# Patient Record
Sex: Female | Born: 1985 | Race: Black or African American | Hispanic: No | Marital: Single | Smoking: Current every day smoker
Health system: Southern US, Community
[De-identification: ages and names within clinical notes are randomized; demographics above are authoritative.]

## PROBLEM LIST (undated history)

## (undated) DIAGNOSIS — I10 Essential (primary) hypertension: Secondary | ICD-10-CM

## (undated) DIAGNOSIS — D571 Sickle-cell disease without crisis: Secondary | ICD-10-CM

## (undated) DIAGNOSIS — D573 Sickle-cell trait: Secondary | ICD-10-CM

---

## 2005-02-02 ENCOUNTER — Emergency Department (HOSPITAL_COMMUNITY): Admission: EM | Admit: 2005-02-02 | Discharge: 2005-02-02 | Payer: Self-pay | Admitting: Emergency Medicine

## 2005-07-06 ENCOUNTER — Emergency Department (HOSPITAL_COMMUNITY): Admission: EM | Admit: 2005-07-06 | Discharge: 2005-07-06 | Payer: Self-pay | Admitting: Emergency Medicine

## 2005-09-14 ENCOUNTER — Emergency Department (HOSPITAL_COMMUNITY): Admission: EM | Admit: 2005-09-14 | Discharge: 2005-09-14 | Payer: Self-pay | Admitting: Emergency Medicine

## 2010-03-26 ENCOUNTER — Emergency Department (HOSPITAL_COMMUNITY): Admission: EM | Admit: 2010-03-26 | Discharge: 2010-03-26 | Payer: Self-pay | Admitting: Emergency Medicine

## 2010-04-08 ENCOUNTER — Emergency Department (HOSPITAL_COMMUNITY): Admission: EM | Admit: 2010-04-08 | Discharge: 2010-04-08 | Payer: Self-pay | Admitting: Emergency Medicine

## 2010-06-10 ENCOUNTER — Emergency Department (HOSPITAL_COMMUNITY): Admission: EM | Admit: 2010-06-10 | Discharge: 2010-06-10 | Payer: Self-pay | Admitting: Emergency Medicine

## 2010-07-21 ENCOUNTER — Emergency Department (HOSPITAL_COMMUNITY): Admission: EM | Admit: 2010-07-21 | Discharge: 2010-07-21 | Payer: Self-pay | Admitting: Emergency Medicine

## 2010-08-02 ENCOUNTER — Emergency Department (HOSPITAL_COMMUNITY): Admission: EM | Admit: 2010-08-02 | Discharge: 2010-08-02 | Payer: Self-pay | Admitting: Emergency Medicine

## 2010-08-10 ENCOUNTER — Emergency Department (HOSPITAL_COMMUNITY): Admission: EM | Admit: 2010-08-10 | Discharge: 2010-08-10 | Payer: Self-pay | Admitting: Emergency Medicine

## 2010-10-05 ENCOUNTER — Emergency Department (HOSPITAL_COMMUNITY)
Admission: EM | Admit: 2010-10-05 | Discharge: 2010-10-06 | Payer: Self-pay | Source: Home / Self Care | Admitting: Emergency Medicine

## 2010-11-02 ENCOUNTER — Inpatient Hospital Stay (HOSPITAL_COMMUNITY)
Admission: EM | Admit: 2010-11-02 | Discharge: 2010-11-05 | Payer: Self-pay | Source: Home / Self Care | Attending: Internal Medicine | Admitting: Internal Medicine

## 2010-11-03 LAB — CARDIAC PANEL(CRET KIN+CKTOT+MB+TROPI)
CK, MB: 1 ng/mL (ref 0.3–4.0)
CK, MB: 1.4 ng/mL (ref 0.3–4.0)
CK, MB: 1.7 ng/mL (ref 0.3–4.0)
Relative Index: INVALID (ref 0.0–2.5)
Relative Index: 1.4 (ref 0.0–2.5)
Relative Index: 1.1 (ref 0.0–2.5)
Total CK: 84 U/L (ref 7–177)
Total CK: 101 U/L (ref 7–177)
Total CK: 149 U/L (ref 7–177)
Troponin I: 0.01 ng/mL (ref 0.00–0.06)
Troponin I: 0.02 ng/mL (ref 0.00–0.06)
Troponin I: 0.05 ng/mL (ref 0.00–0.06)

## 2010-11-03 LAB — POCT I-STAT, CHEM 8
BUN: 5 mg/dL — ABNORMAL LOW (ref 6–23)
Calcium, Ion: 1.23 mmol/L (ref 1.12–1.32)
Chloride: 104 mEq/L (ref 96–112)
Creatinine, Ser: 0.8 mg/dL (ref 0.4–1.2)
Glucose, Bld: 62 mg/dL — ABNORMAL LOW (ref 70–99)
HCT: 44 % (ref 36.0–46.0)
Hemoglobin: 15 g/dL (ref 12.0–15.0)
Potassium: 3.9 mEq/L (ref 3.5–5.1)
Sodium: 140 mEq/L (ref 135–145)
TCO2: 28 mmol/L (ref 0–100)

## 2010-11-03 LAB — DIFFERENTIAL
Basophils Absolute: 0.1 10*3/uL (ref 0.0–0.1)
Basophils Relative: 1 % (ref 0–1)
Eosinophils Absolute: 0.2 10*3/uL (ref 0.0–0.7)
Eosinophils Relative: 3 % (ref 0–5)
Lymphocytes Relative: 32 % (ref 12–46)
Lymphs Abs: 1.9 10*3/uL (ref 0.7–4.0)
Monocytes Absolute: 0.6 10*3/uL (ref 0.1–1.0)
Monocytes Relative: 9 % (ref 3–12)
Neutro Abs: 3.3 10*3/uL (ref 1.7–7.7)
Neutrophils Relative %: 55 % (ref 43–77)

## 2010-11-03 LAB — MRSA PCR SCREENING: MRSA by PCR: NEGATIVE

## 2010-11-03 LAB — TSH: TSH: 0.147 u[IU]/mL — ABNORMAL LOW (ref 0.350–4.500)

## 2010-11-03 LAB — RAPID URINE DRUG SCREEN, HOSP PERFORMED
Amphetamines: NOT DETECTED
Barbiturates: NOT DETECTED
Benzodiazepines: NOT DETECTED
Cocaine: NOT DETECTED
Opiates: NOT DETECTED
Tetrahydrocannabinol: NOT DETECTED

## 2010-11-03 LAB — CBC
HCT: 35.1 % — ABNORMAL LOW (ref 36.0–46.0)
Hemoglobin: 12.6 g/dL (ref 12.0–15.0)
MCH: 32.3 pg (ref 26.0–34.0)
MCHC: 35.9 g/dL (ref 30.0–36.0)
MCV: 90 fL (ref 78.0–100.0)
Platelets: 188 10*3/uL (ref 150–400)
RBC: 3.9 MIL/uL (ref 3.87–5.11)
RDW: 14.8 % (ref 11.5–15.5)
WBC: 6.1 10*3/uL (ref 4.0–10.5)

## 2010-11-03 LAB — D-DIMER, QUANTITATIVE: D-Dimer, Quant: 0.43 ug/mL-FEU (ref 0.00–0.48)

## 2010-11-03 LAB — PREGNANCY, URINE: Preg Test, Ur: NEGATIVE

## 2010-11-05 LAB — URINALYSIS, ROUTINE W REFLEX MICROSCOPIC
Bilirubin Urine: NEGATIVE
Hgb urine dipstick: NEGATIVE
Ketones, ur: NEGATIVE mg/dL
Nitrite: NEGATIVE
Protein, ur: NEGATIVE mg/dL
Specific Gravity, Urine: 1.015 (ref 1.005–1.030)
Urine Glucose, Fasting: NEGATIVE mg/dL
Urobilinogen, UA: 0.2 mg/dL (ref 0.0–1.0)
pH: 7 (ref 5.0–8.0)

## 2010-11-05 LAB — CBC
HCT: 31 % — ABNORMAL LOW (ref 36.0–46.0)
Hemoglobin: 10.9 g/dL — ABNORMAL LOW (ref 12.0–15.0)
MCH: 31.5 pg (ref 26.0–34.0)
MCHC: 35.2 g/dL (ref 30.0–36.0)
MCV: 89.6 fL (ref 78.0–100.0)
Platelets: 170 10*3/uL (ref 150–400)
RBC: 3.46 MIL/uL — ABNORMAL LOW (ref 3.87–5.11)
RDW: 14.8 % (ref 11.5–15.5)
WBC: 4 10*3/uL (ref 4.0–10.5)

## 2010-11-05 LAB — BASIC METABOLIC PANEL
BUN: 6 mg/dL (ref 6–23)
CO2: 28 mEq/L (ref 19–32)
Calcium: 9.2 mg/dL (ref 8.4–10.5)
Chloride: 104 mEq/L (ref 96–112)
Creatinine, Ser: 0.64 mg/dL (ref 0.4–1.2)
GFR calc Af Amer: >60 mL/min (ref >60)
GFR calc non Af Amer: >60 mL/min (ref >60)
Glucose, Bld: 95 mg/dL (ref 70–99)
Potassium: 3.9 mEq/L (ref 3.5–5.1)
Sodium: 139 mEq/L (ref 135–145)

## 2010-11-05 LAB — T4, FREE: Free T4: 0.94 ng/dL (ref 0.80–1.80)

## 2010-11-05 NOTE — H&P (Signed)
NAMEXAVIER, Sylvia Vega NO.:  [id number]  MEDICAL RECORD NO.:  [id number]          PATIENT TYPE:  INP  LOCATION:  0103                         FACILITY:  [facility]  PHYSICIAN:  Houston Siren, MD           DATE OF BIRTH:  26-Apr-1986  DATE OF ADMISSION:  11/02/2010 DATE OF DISCHARGE:                             HISTORY & PHYSICAL   PRIMARY CARE PHYSICIAN:  No primary care physician.  REASON FOR ADMISSION:  Tachycardia.   ADVANCED DIRECTIVE: FULL CODE  HISTORY OF PRESENT ILLNESS:  This is a 25 year old female with history of left carotid tumor, status post surgical excision along with tobacco use; prior history of tachycardia; migraine headache; sickle cell trait, presented to the emergency room with upper respiratory infection symptoms.  She stated she has been coughing along with having chest pain and palpitations.  She has also been mildly short of breath as well for the past week.  She has no ill contact or any distant travel.  She was given 2 nebulizer treatments in the emergency room which resulted increased in heart rate from 110 which is her baseline up to 140 and sustained.  An EKG was performed showing sinus tachycardia without any acute ST-T changes.  She also has a normal white count of 6,100, hemoglobin of 12.6.  Her chest x-ray is clear.  She was given Dilaudid and Ativan and her heart rate still sustained at around 130-140.  Her creatinine is 0.8.  Hospitalist was asked to admit the patient because of the tachycardia.  She denies any drug use, specifically no cocaine. She did admit to taking over-the-counter decongestant for past few days.  PAST MEDICAL HISTORY:  As above.  SOCIAL HISTORY:  She is a tobacco user, but denied any alcohol or drug use.  She has a partner who is at her bedside.  MEDICATIONS:  She is supposed to be on folic acid, iron, amitriptyline, Depakote, ibuprofen, but she has not taken this medication for many months.  ALLERGIES:   No known drug allergies.  PHYSICAL EXAMINATION:  VITAL SIGNS:  Blood pressure 107/54, heart rate 130-140, respiratory rate of 18, temperature 98.5. GENERAL:  She is hoarse, but in no apparent distress.  She has facial symmetry and speech is fluent. NECK:  Supple, status post left surgical excision scar well healed of the carotid tumor. CARDIAC:  S1 and S2, regular and tachy with no murmur, rub, or gallop. LUNGS:  Scattered rhonchi, but no wheezes, no rales. ABDOMINAL:  Soft, nondistended, nontender. EXTREMITIES:  No tremor.  No edema.  No calf tenderness.  Good distal pulses bilaterally. SKIN:  Warm and dry.  OBJECTIVE FINDINGS:  EKG shows sinus tachycardia at 138 without any acute ST-T changes.  Creatinine of 0.8, glucose of 62.  White count of 6100, hemoglobin of 12.6.  Chest x-ray showed no active disease.  IMPRESSION:  This is a 25 year old female with upper respiratory symptoms, presents with persistent tachycardia of unclear exact etiology.  She could have tachycardia from chest pain, anxiety, or over- the-counter decongestants.  She did have carotid tumor removed and I wonder whether or not  this has upset her feedback regulation as well.  Since she is tachycardic and symptomatic from it, we will start her on IV Cardizem and put her in the step-down unit.  I will treat her upper respiratory infection with Zithromax and Rocephin.  She does not need any more nebulizer treatment nor prednisone.  As part of workup, we will get urinary drug screen, pregnancy test, 24-hour urine for VMA and metanephrines, and TSH.  She is currently stable.  We will give her some pain medication and rule out with serial CPKs and troponins as well.  She will need an echo to make sure she has a normal structural heart.     Houston Siren, MD     PL/MEDQ  D:  11/02/2010  T:  11/02/2010  Job:  [id number]  Electronically Signed by Houston Siren  on 11/03/2010 05:10:21 AM

## 2010-11-05 NOTE — Discharge Summary (Signed)
Sylvia Vega, Sylvia Vega             ACCOUNT NO.:  [id number]  MEDICAL RECORD NO.:  [id number]          PATIENT TYPE:  INP  LOCATION:  1427                         FACILITY:  [facility]  PHYSICIAN:  Andreas Blower, MD       DATE OF BIRTH:  1986-07-10  DATE OF ADMISSION:  11/02/2010 DATE OF DISCHARGE:                              DISCHARGE SUMMARY   PRIMARY CARE PHYSICIAN:  None.  DISCHARGE DIAGNOSES: 1. Tachycardia, resolved. 2. Chest pain, resolved. 3. Cough due to bronchitis. 4. Bronchitis. 5. Abdominal pain secondary to constipation. 6. Constipation. 7. Questionable right uterovesical junction stone. 8. History of migraines. 9. History of sickle cell trait 10.History of left carotid tumor. 11.Tobacco use. 12.Hypothyroidism.  DISCHARGE MEDICATIONS: 1. Azithromycin 250 mg p.o. daily for 2 more days. 2. Bisacodyl suppository 10 mg daily as needed for constipation. 3. Docusate 100 mg p.o. twice daily for 30 days, hold if any diarrhea. 4. Levothyroxine 25 mcg p.o. daily. 5. Zofran 4 mg q.6h. as needed. 6. Polyethylene glycol 17 g p.o. daily. 7. Senna 1 tablet p.o. b.i.d. 8. Oxycodone/acetaminophen 5/325 mg every 6 hours as needed. 9. Ferrous sulfate 325 mg p.o. daily. 10.Folic acid 1 mg p.o. daily. 11.Ibuprofen 800 mg p.o. every 8 hours as needed for pain. 12.Imitrex p.o. daily as needed for headaches. 13.Omeprazole 20 mg p.o. daily. 14.Zyrtec 10 mg p.o. daily.  BRIEF ADMITTING HISTORY AND PHYSICAL:  Sylvia Vega is a 25 year old African American female with history of left carotid tumor, status post surgically excision, no tobacco use and migraines sickle cell trait who presented with upper respiratory infection symptoms and tachycardia.  RADIOLOGY/IMAGING: 1. The patient had a chest x-ray, 2-view which shows no active lung     disease. 2. The patient had x-ray of the abdomen which shows marked stool     burden throughout the colon.  No evidence of obstruction.  Calcification of left side of the pelvis new since 2006 may reflect     new phlebolith, could not exclude distal ureteral stone. 3. The patient had a CT of the abdomen, pelvis without contrast which     showed poor visualization of uterus secondary to little intra-     abdominal peritoneal bath.  Possible right UVJ stone.  No definite     left ureteral calculi, no hydronephrosis.  LABORATORY DATA:  CBC shows a white count of 4.0, hemoglobin 10.9, hematocrit 31.0, platelet count 170.  Electrolytes normal with a creatinine of 0.64.  Troponins negative x3.  Initial TSH was 0.147, free T4 was 0.94.  Urine pregnancy was negative.  Urine drug screen was negative.  UA was negative for nitrites, leukocytes, and was negative for blood.  MRSA by PCR was negative.  Urine and serum catecholamines are pending.  DISPOSITION AND FOLLOWUP:  The patient was instructed to contact her insurance and find a PCP within her network provider in [place] or back home.  The patient was also given instructions to minimize pain medications as much as possible and if her abdominal pain symptoms are not improving, she needs to follow up with her primary care physician and consider a referal to GI.  The patient was also given instructions to have her primary care physician follow up on her urine and serum and catecholamine results.  ASSESSMENT/PLAN: 1. Tachycardia, etiology unclear, most likely secondary to     dehydration.  The patient was initially started on diltiazem drip     and was eventually weaned off of diltiazem after she was     successfully hydrated.  Heart rate at the time of discharge was     normal at 80.  The patient had urine and serum catecholamines sent     and the patient will need her primary care physician to follow up     on these results.  This was first discussed with the patient. 2. Abdominal pain, etiology unclear.  The patient had x-ray which     showed a stool burden.  As a result,  was started on stool     softeners, GoLYTELY, MiraLax, and magnesium citrate.  During the     course of hospital stay after she has had a few bowel movements,     her abdominal pain improved significantly.  The patient was given     strict instructions to minimize pain medications as much as     possible at home.  I also instructed the patient that if her     abdominal pain does not improve, then she needs to contact her     primary care physician to see if she would warrant a GI referral.     The patient received a CT of the abdomen and pelvis without     contrast which showed questionable right UVJ stone.  UA was     obtained which did not show any hematuria.  The patient on exam did     not have any significant flank pain.  Also on CT, did not show any     hydronephrosis.  Would recommend conservative management for now.     If she does not improve, could consider further workup by her     primary care physician. 3. Chest pain.  A D-dimer was normal.  Troponins were negative.  The     patient was ruled out.  The patient had a cough which may be the     reason why she is having chest pain.  The patient was started     initially on ceftriaxone and azithromycin.  At the time of     discharge, she will continue with azithromycin for two more days     for bronchitis. 4. History of migraines, was not an active issue during the course of     hospital stay. 5. History is sickle cell trait, not an active issue during the course     of hospital stay. 6. History of left carotid tumor, was not an active issue during the     hospital stay.  Will have her primary care physician follow up with     this issue. 7. Hypothyroidism.  The patient was started on Synthroid.  Free T4 was     normal.  The patient to follow up with her primary care physician     for further titration of her Synthroid as an outpatient.  Time spent on discharge talking with the patient and coordinating care was 35  minutes.   Andreas Blower, MD   SR/MEDQ  D:  11/05/2010  T:  11/05/2010  Job:  [id number]  Electronically Signed by Wardell Heath Loneta Tamplin  on 11/05/2010 08:39:17 PM

## 2010-11-06 LAB — URINE CULTURE
Colony Count: NO GROWTH
Culture  Setup Time: 201201271407
Culture: NO GROWTH
Special Requests: NEGATIVE

## 2010-11-11 LAB — CATECHOLAMINES, FRACTIONATED, URINE, 24 HOUR
Catecholamines T: 18 mcg/24 h — ABNORMAL LOW (ref 26–121)
Creatinine, Urine mg/day-CATEUR: 0.96 g/(24.h) (ref 0.63–2.50)
Dopamine 24 Hr Urine: 112 mcg/24 h (ref 52–480)
Norepinephrine 24 Hr Urine: 18 mcg/24 h (ref 15–100)
Total urine volume: 2050 mL

## 2010-11-15 LAB — VMA AND HVA, 24 HR UR
24 Hour urine volume (VMAHVA): 2050 mL/24 h
Creatinine, Urine mg/day-VMAUR: 0.98 g/(24.h) (ref 0.63–2.50)
Homovanillic Acid, 24H Ur: 1.8 mg/24 h (ref 1.6–7.5)
VMA, 24H Ur Adult: 2.3 mg/24 h (ref <=6.0)

## 2010-11-23 ENCOUNTER — Emergency Department (HOSPITAL_COMMUNITY)
Admission: EM | Admit: 2010-11-23 | Discharge: 2010-11-24 | Disposition: A | Discharge status: Home / Self Care | Payer: BC Managed Care – PPO | Attending: Emergency Medicine | Admitting: Emergency Medicine

## 2010-11-23 DIAGNOSIS — M545 Low back pain, unspecified: Secondary | ICD-10-CM | POA: Insufficient documentation

## 2010-11-23 DIAGNOSIS — R10812 Left upper quadrant abdominal tenderness: Secondary | ICD-10-CM | POA: Insufficient documentation

## 2010-11-23 DIAGNOSIS — D573 Sickle-cell trait: Secondary | ICD-10-CM | POA: Insufficient documentation

## 2010-11-23 LAB — URINALYSIS, ROUTINE W REFLEX MICROSCOPIC
Bilirubin Urine: NEGATIVE
Hgb urine dipstick: NEGATIVE
Ketones, ur: NEGATIVE mg/dL
Nitrite: NEGATIVE
Protein, ur: NEGATIVE mg/dL
Specific Gravity, Urine: 1.018 (ref 1.005–1.030)
Urine Glucose, Fasting: NEGATIVE mg/dL
Urobilinogen, UA: 0.2 mg/dL (ref 0.0–1.0)
pH: 6 (ref 5.0–8.0)

## 2010-11-23 LAB — POCT PREGNANCY, URINE: Preg Test, Ur: NEGATIVE

## 2010-12-22 LAB — URINALYSIS, ROUTINE W REFLEX MICROSCOPIC
Bilirubin Urine: NEGATIVE
Glucose, UA: NEGATIVE mg/dL
Hgb urine dipstick: NEGATIVE
Ketones, ur: NEGATIVE mg/dL
Nitrite: NEGATIVE
Protein, ur: NEGATIVE mg/dL
Specific Gravity, Urine: 1.019 (ref 1.005–1.030)
Urobilinogen, UA: 1 mg/dL (ref 0.0–1.0)
pH: 6 (ref 5.0–8.0)

## 2010-12-22 LAB — URINE MICROSCOPIC-ADD ON

## 2010-12-22 LAB — POCT PREGNANCY, URINE: Preg Test, Ur: NEGATIVE

## 2010-12-23 LAB — COMPREHENSIVE METABOLIC PANEL
ALT: 12 U/L (ref 0–35)
AST: 18 U/L (ref 0–37)
Albumin: 4.4 g/dL (ref 3.5–5.2)
Alkaline Phosphatase: 57 U/L (ref 39–117)
BUN: 9 mg/dL (ref 6–23)
CO2: 25 mEq/L (ref 19–32)
Calcium: 9.7 mg/dL (ref 8.4–10.5)
Chloride: 106 mEq/L (ref 96–112)
Creatinine, Ser: 0.75 mg/dL (ref 0.4–1.2)
GFR calc Af Amer: >60 mL/min (ref >60)
GFR calc non Af Amer: >60 mL/min (ref >60)
Glucose, Bld: 96 mg/dL (ref 70–99)
Potassium: 4.3 mEq/L (ref 3.5–5.1)
Sodium: 139 mEq/L (ref 135–145)
Total Bilirubin: 0.6 mg/dL (ref 0.3–1.2)
Total Protein: 7.7 g/dL (ref 6.0–8.3)

## 2010-12-23 LAB — DIFFERENTIAL
Basophils Absolute: 0 10*3/uL (ref 0.0–0.1)
Basophils Relative: 1 % (ref 0–1)
Eosinophils Absolute: 0.2 10*3/uL (ref 0.0–0.7)
Eosinophils Relative: 4 % (ref 0–5)
Lymphocytes Relative: 34 % (ref 12–46)
Lymphs Abs: 1.8 10*3/uL (ref 0.7–4.0)
Monocytes Absolute: 0.6 10*3/uL (ref 0.1–1.0)
Monocytes Relative: 11 % (ref 3–12)
Neutro Abs: 2.7 10*3/uL (ref 1.7–7.7)
Neutrophils Relative %: 51 % (ref 43–77)

## 2010-12-23 LAB — URINALYSIS, ROUTINE W REFLEX MICROSCOPIC
Bilirubin Urine: NEGATIVE
Glucose, UA: NEGATIVE mg/dL
Hgb urine dipstick: NEGATIVE
Ketones, ur: NEGATIVE mg/dL
Nitrite: NEGATIVE
Protein, ur: NEGATIVE mg/dL
Specific Gravity, Urine: 1.006 (ref 1.005–1.030)
Urobilinogen, UA: 0.2 mg/dL (ref 0.0–1.0)
pH: 6 (ref 5.0–8.0)

## 2010-12-23 LAB — CBC
HCT: 39.9 % (ref 36.0–46.0)
Hemoglobin: 13.8 g/dL (ref 12.0–15.0)
MCH: 33.3 pg (ref 26.0–34.0)
MCHC: 34.5 g/dL (ref 30.0–36.0)
MCV: 96.6 fL (ref 78.0–100.0)
Platelets: 191 10*3/uL (ref 150–400)
RBC: 4.13 MIL/uL (ref 3.87–5.11)
RDW: 15.4 % (ref 11.5–15.5)
WBC: 5.3 10*3/uL (ref 4.0–10.5)

## 2010-12-23 LAB — PREGNANCY, URINE
Preg Test, Ur: NEGATIVE
Preg Test, Ur: NEGATIVE

## 2010-12-26 ENCOUNTER — Emergency Department (HOSPITAL_COMMUNITY)
Admission: EM | Admit: 2010-12-26 | Discharge: 2010-12-26 | Disposition: A | Discharge status: Home / Self Care | Payer: BC Managed Care – PPO | Attending: Emergency Medicine | Admitting: Emergency Medicine

## 2010-12-26 DIAGNOSIS — J45909 Unspecified asthma, uncomplicated: Secondary | ICD-10-CM | POA: Insufficient documentation

## 2010-12-26 DIAGNOSIS — K089 Disorder of teeth and supporting structures, unspecified: Secondary | ICD-10-CM | POA: Insufficient documentation

## 2010-12-26 DIAGNOSIS — D573 Sickle-cell trait: Secondary | ICD-10-CM | POA: Insufficient documentation

## 2010-12-26 LAB — URINALYSIS, ROUTINE W REFLEX MICROSCOPIC
Bilirubin Urine: NEGATIVE
Glucose, UA: NEGATIVE mg/dL
Ketones, ur: NEGATIVE mg/dL
Nitrite: NEGATIVE
Protein, ur: 30 mg/dL — AB
Specific Gravity, Urine: 1.017 (ref 1.005–1.030)
Urobilinogen, UA: 1 mg/dL (ref 0.0–1.0)
pH: 7 (ref 5.0–8.0)

## 2010-12-26 LAB — POCT I-STAT, CHEM 8
BUN: 6 mg/dL (ref 6–23)
Calcium, Ion: 1.15 mmol/L (ref 1.12–1.32)
Chloride: 107 mEq/L (ref 96–112)
Creatinine, Ser: 0.7 mg/dL (ref 0.4–1.2)
Glucose, Bld: 93 mg/dL (ref 70–99)
HCT: 37 % (ref 36.0–46.0)
Hemoglobin: 12.6 g/dL (ref 12.0–15.0)
Potassium: 3.7 mEq/L (ref 3.5–5.1)
Sodium: 141 mEq/L (ref 135–145)
TCO2: 24 mmol/L (ref 0–100)

## 2010-12-26 LAB — URINE CULTURE: Colony Count: >=100000

## 2010-12-26 LAB — URINE MICROSCOPIC-ADD ON

## 2010-12-26 LAB — POCT PREGNANCY, URINE: Preg Test, Ur: NEGATIVE

## 2011-01-05 ENCOUNTER — Emergency Department (HOSPITAL_COMMUNITY): Payer: BC Managed Care – PPO

## 2011-01-05 ENCOUNTER — Emergency Department (HOSPITAL_COMMUNITY)
Admission: EM | Admit: 2011-01-05 | Discharge: 2011-01-06 | Disposition: A | Discharge status: Home / Self Care | Payer: BC Managed Care – PPO | Attending: Emergency Medicine | Admitting: Emergency Medicine

## 2011-01-05 DIAGNOSIS — D5700 Hb-SS disease with crisis, unspecified: Secondary | ICD-10-CM | POA: Insufficient documentation

## 2011-01-05 DIAGNOSIS — R109 Unspecified abdominal pain: Secondary | ICD-10-CM | POA: Insufficient documentation

## 2011-01-05 DIAGNOSIS — M545 Low back pain, unspecified: Secondary | ICD-10-CM | POA: Insufficient documentation

## 2011-01-05 DIAGNOSIS — A499 Bacterial infection, unspecified: Secondary | ICD-10-CM | POA: Insufficient documentation

## 2011-01-05 DIAGNOSIS — N760 Acute vaginitis: Secondary | ICD-10-CM | POA: Insufficient documentation

## 2011-01-05 DIAGNOSIS — B9689 Other specified bacterial agents as the cause of diseases classified elsewhere: Secondary | ICD-10-CM | POA: Insufficient documentation

## 2011-01-05 DIAGNOSIS — M79609 Pain in unspecified limb: Secondary | ICD-10-CM | POA: Insufficient documentation

## 2011-01-05 DIAGNOSIS — R110 Nausea: Secondary | ICD-10-CM | POA: Insufficient documentation

## 2011-01-05 LAB — COMPREHENSIVE METABOLIC PANEL
ALT: 9 U/L (ref 0–35)
AST: 13 U/L (ref 0–37)
Albumin: 4.1 g/dL (ref 3.5–5.2)
Alkaline Phosphatase: 67 U/L (ref 39–117)
BUN: 9 mg/dL (ref 6–23)
CO2: 22 mEq/L (ref 19–32)
Calcium: 9.3 mg/dL (ref 8.4–10.5)
Chloride: 109 mEq/L (ref 96–112)
Creatinine, Ser: 0.69 mg/dL (ref 0.4–1.2)
GFR calc Af Amer: >60 mL/min (ref >60)
GFR calc non Af Amer: >60 mL/min (ref >60)
Glucose, Bld: 73 mg/dL (ref 70–99)
Potassium: 3.5 mEq/L (ref 3.5–5.1)
Sodium: 139 mEq/L (ref 135–145)
Total Bilirubin: 0.5 mg/dL (ref 0.3–1.2)
Total Protein: 7.1 g/dL (ref 6.0–8.3)

## 2011-01-05 LAB — RETICULOCYTES
RBC.: 3.69 MIL/uL — ABNORMAL LOW (ref 3.87–5.11)
Retic Count, Absolute: 51.7 10*3/uL (ref 19.0–186.0)
Retic Ct Pct: 1.4 % (ref 0.4–3.1)

## 2011-01-05 LAB — DIFFERENTIAL
Basophils Absolute: 0 10*3/uL (ref 0.0–0.1)
Basophils Relative: 1 % (ref 0–1)
Eosinophils Absolute: 0.3 10*3/uL (ref 0.0–0.7)
Eosinophils Relative: 4 % (ref 0–5)
Lymphocytes Relative: 47 % — ABNORMAL HIGH (ref 12–46)
Lymphs Abs: 3 10*3/uL (ref 0.7–4.0)
Monocytes Absolute: 0.6 10*3/uL (ref 0.1–1.0)
Monocytes Relative: 9 % (ref 3–12)
Neutro Abs: 2.5 10*3/uL (ref 1.7–7.7)
Neutrophils Relative %: 39 % — ABNORMAL LOW (ref 43–77)

## 2011-01-05 LAB — CBC
HCT: 33.6 % — ABNORMAL LOW (ref 36.0–46.0)
Hemoglobin: 12 g/dL (ref 12.0–15.0)
MCH: 32.5 pg (ref 26.0–34.0)
MCHC: 35.7 g/dL (ref 30.0–36.0)
MCV: 91.1 fL (ref 78.0–100.0)
Platelets: 174 10*3/uL (ref 150–400)
RBC: 3.69 MIL/uL — ABNORMAL LOW (ref 3.87–5.11)
RDW: 14.3 % (ref 11.5–15.5)
WBC: 6.5 10*3/uL (ref 4.0–10.5)

## 2011-01-06 ENCOUNTER — Emergency Department (HOSPITAL_COMMUNITY)
Admission: EM | Admit: 2011-01-06 | Discharge: 2011-01-07 | Disposition: A | Discharge status: Home / Self Care | Payer: BC Managed Care – PPO | Attending: Emergency Medicine | Admitting: Emergency Medicine

## 2011-01-06 DIAGNOSIS — D5700 Hb-SS disease with crisis, unspecified: Secondary | ICD-10-CM | POA: Insufficient documentation

## 2011-01-06 DIAGNOSIS — J45909 Unspecified asthma, uncomplicated: Secondary | ICD-10-CM | POA: Insufficient documentation

## 2011-01-06 LAB — URINALYSIS, ROUTINE W REFLEX MICROSCOPIC
Bilirubin Urine: NEGATIVE
Glucose, UA: NEGATIVE mg/dL
Hgb urine dipstick: NEGATIVE
Ketones, ur: NEGATIVE mg/dL
Nitrite: NEGATIVE
Protein, ur: NEGATIVE mg/dL
Specific Gravity, Urine: 1.02 (ref 1.005–1.030)
Urobilinogen, UA: 0.2 mg/dL (ref 0.0–1.0)
pH: 6 (ref 5.0–8.0)

## 2011-01-06 LAB — WET PREP, GENITAL
Trich, Wet Prep: NONE SEEN
Yeast Wet Prep HPF POC: NONE SEEN

## 2011-01-06 LAB — GC/CHLAMYDIA PROBE AMP, GENITAL
Chlamydia, DNA Probe: NEGATIVE
GC Probe Amp, Genital: NEGATIVE

## 2011-01-06 LAB — POCT PREGNANCY, URINE: Preg Test, Ur: NEGATIVE

## 2011-01-10 IMAGING — CT CT NECK W/ CM
3 series · 13 of 20 positions shown, 15 images · IV contrast (agent unspecified)
Comparison: None.

CLINICAL DATA: History of carotid body tumor removed.  Now with
left-sided neck pain

CT NECK WITH CONTRAST
TECHNIQUE: Multidetector CT imaging of the neck was performed with
intravenous contrast.
Contrast: 100 ml [id number]

[Series 2: neck rtn st · axial · 0.39mm/px · z∈[+825,+957]mm · 5 of 67 slices shown]
[im 12/67  bone]
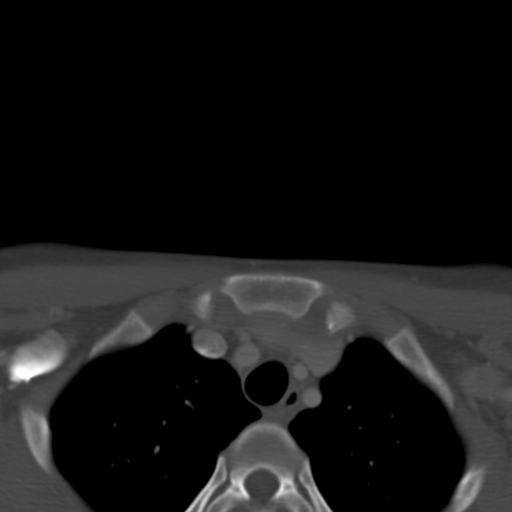
[im 23/67  bone]
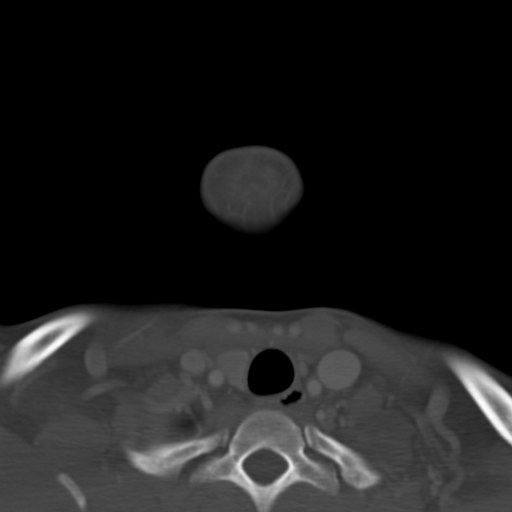
[im 34/67  bone]
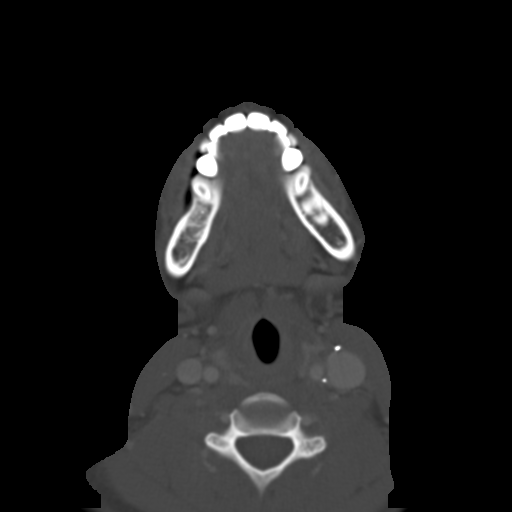
[im 45/67  bone]
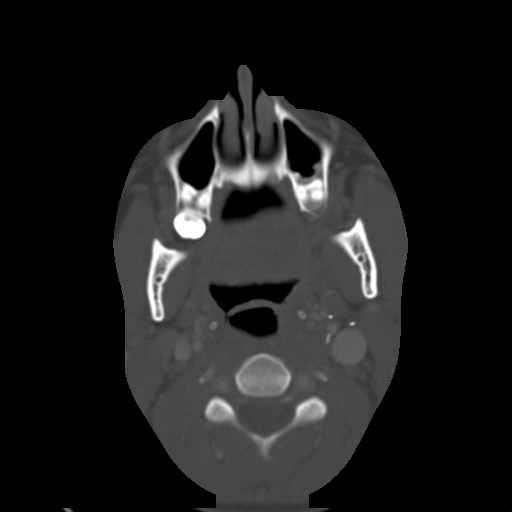
[im 56/67  bone]
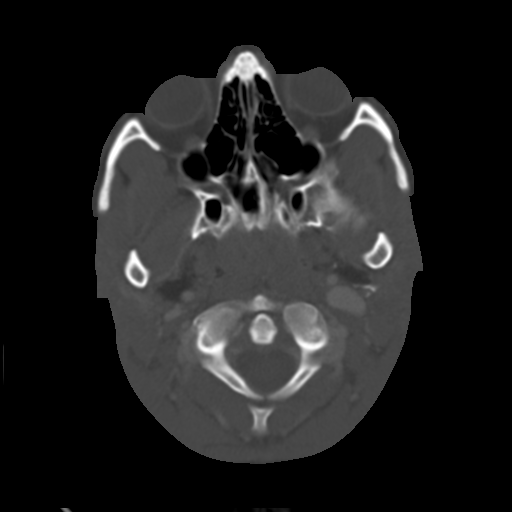

[Series 602: <mpr thick range> · axial · 0.39mm/px · z∈[+798,+929]mm · 5 of 66 slices shown, 7 images]
[im 11/66  soft-tissue]
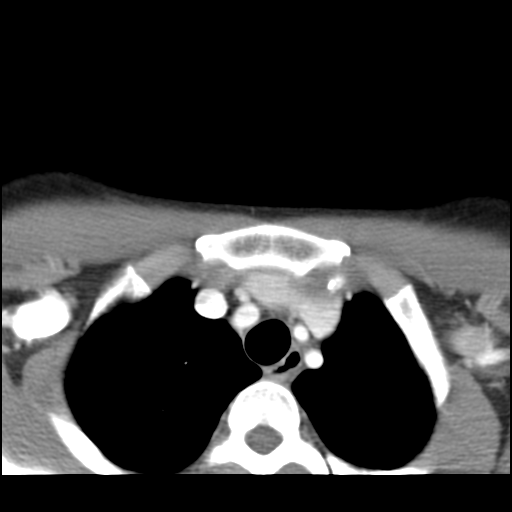
[im 11/66  bone]
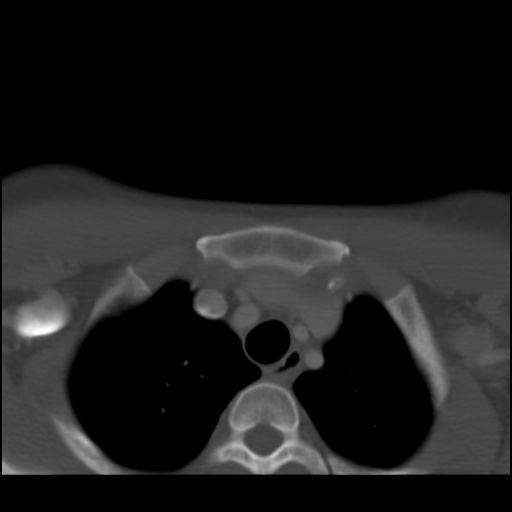
[im 22/66  bone]
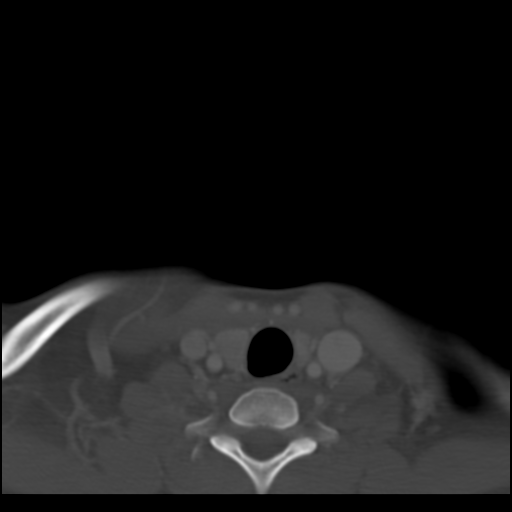
[im 33/66  bone]
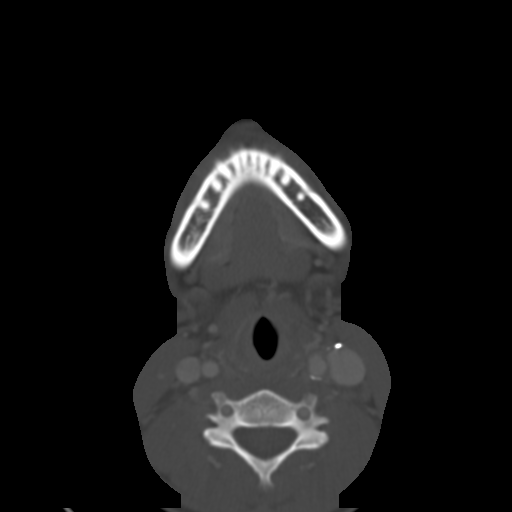
[im 44/66  bone]
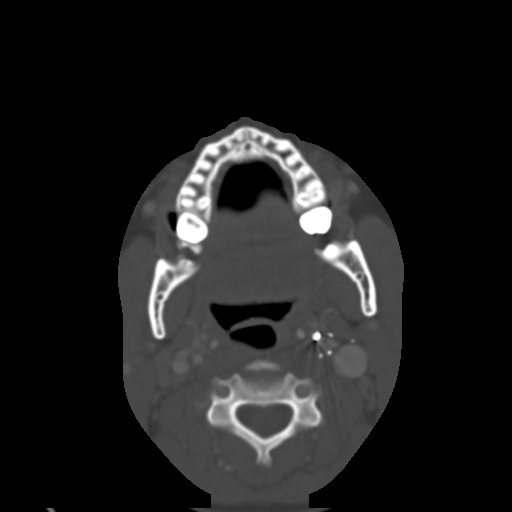
[im 55/66  soft-tissue]
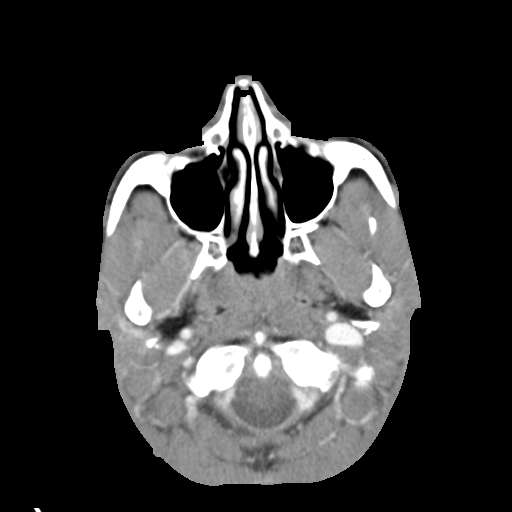
[im 55/66  bone]
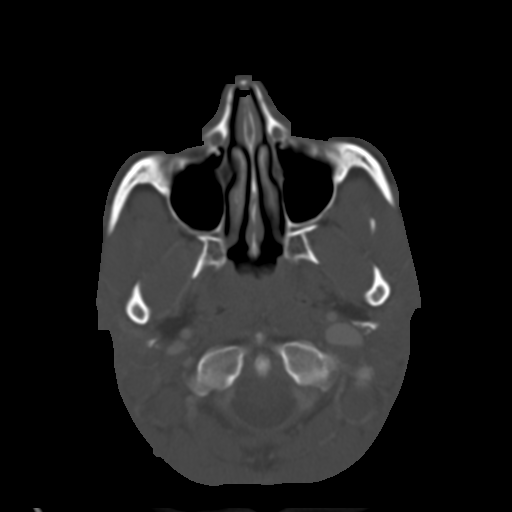

[Series 603: <mpr thick range(1)> · coronal · 0.39mm/px · 3 of 64 slices shown]
[im 15/64  bone]
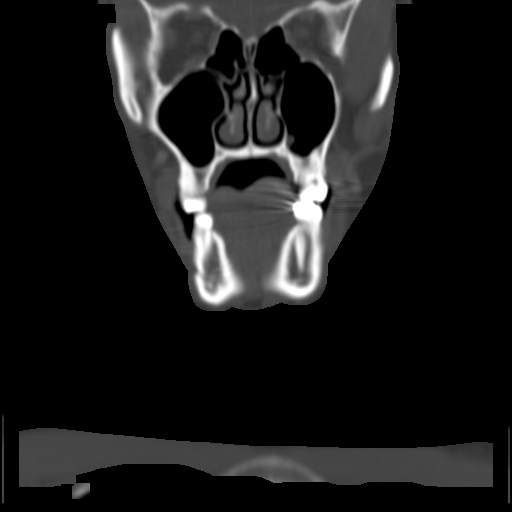
[im 26/64  bone]
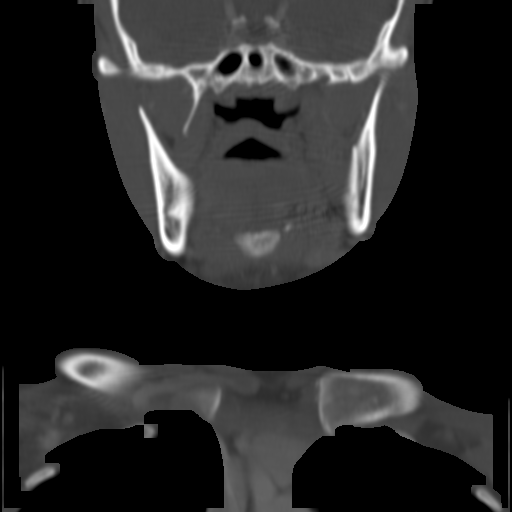
[im 38/64  bone]
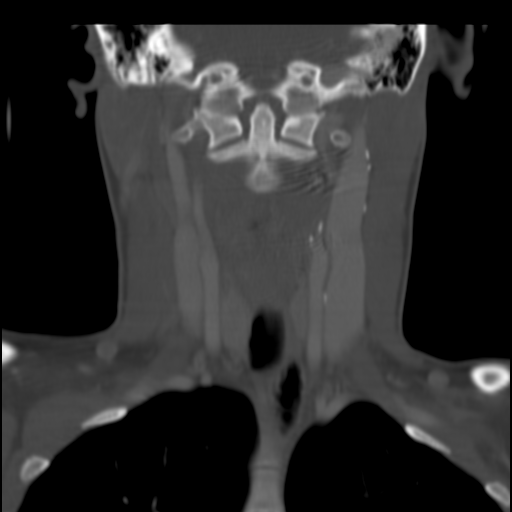

[13 of 20 positions shown; findings below may reference images not displayed]

FINDINGS: Multiple surgical clips are present surrounding the left
carotid bifurcation which represents sequelae from surgical
removal.   I do not see an enhancing mass suggesting recurrent
tumor.  The right carotid bifurcation is normal.  Both internal
jugular veins are widely patent.   Major and minor salivary glands
are symmetric and unremarkable.  There is no significant
lymphadenopathy.  Thyroid gland is normal.  Airway is midline.  No
spinal abnormality is seen.  Lung apices are clear.

Mild fullness in the nasopharyngeal and oropharyngeal region on the
left  is felt to be postsurgical in nature.  However this  area is
amenable to direct inspection and I would recommend correlation
with physical examination, specifically the left oropharynx and
nasopharynx.  There is asymmetric aeration of the left vallecula
and aryepiglottic fold as compared to the right, again likely
representing scarring.
IMPRESSION: No evidence for recurrence of what reportedly has been removal of a
carotid body tumor.  No significant lymphadenopathy.

Mild asymmetry of the nasopharynx and oropharynx on the left felt
to be postsurgical in nature; no significant inflammatory process
is observed.  Given this asymmetry however, I would recommend
correlation with direct physical examination to exclude an
unrelated mucosal lesion.

## 2011-04-13 ENCOUNTER — Emergency Department (HOSPITAL_COMMUNITY)
Admission: EM | Admit: 2011-04-13 | Discharge: 2011-04-13 | Disposition: A | Discharge status: Home / Self Care | Payer: BC Managed Care – PPO | Attending: Emergency Medicine | Admitting: Emergency Medicine

## 2011-04-13 DIAGNOSIS — D561 Beta thalassemia: Secondary | ICD-10-CM | POA: Insufficient documentation

## 2011-04-13 DIAGNOSIS — G8929 Other chronic pain: Secondary | ICD-10-CM | POA: Insufficient documentation

## 2011-04-13 LAB — DIFFERENTIAL
Basophils Absolute: 0 10*3/uL (ref 0.0–0.1)
Basophils Relative: 1 % (ref 0–1)
Eosinophils Absolute: 0.3 10*3/uL (ref 0.0–0.7)
Eosinophils Relative: 4 % (ref 0–5)
Lymphocytes Relative: 52 % — ABNORMAL HIGH (ref 12–46)
Lymphs Abs: 3.6 10*3/uL (ref 0.7–4.0)
Monocytes Absolute: 0.8 10*3/uL (ref 0.1–1.0)
Monocytes Relative: 11 % (ref 3–12)
Neutro Abs: 2.3 10*3/uL (ref 1.7–7.7)
Neutrophils Relative %: 32 % — ABNORMAL LOW (ref 43–77)

## 2011-04-13 LAB — CBC
HCT: 31.1 % — ABNORMAL LOW (ref 36.0–46.0)
Hemoglobin: 11 g/dL — ABNORMAL LOW (ref 12.0–15.0)
MCH: 31.8 pg (ref 26.0–34.0)
MCHC: 35.4 g/dL (ref 30.0–36.0)
MCV: 89.9 fL (ref 78.0–100.0)
Platelets: 160 10*3/uL (ref 150–400)
RBC: 3.46 MIL/uL — ABNORMAL LOW (ref 3.87–5.11)
RDW: 12.9 % (ref 11.5–15.5)
WBC: 7 10*3/uL (ref 4.0–10.5)

## 2011-04-13 LAB — RETICULOCYTES
RBC.: 3.46 MIL/uL — ABNORMAL LOW (ref 3.87–5.11)
Retic Count, Absolute: 20.8 10*3/uL (ref 19.0–186.0)
Retic Ct Pct: 0.6 % (ref 0.4–3.1)

## 2011-05-18 ENCOUNTER — Emergency Department (HOSPITAL_COMMUNITY): Payer: BC Managed Care – PPO

## 2011-05-18 ENCOUNTER — Emergency Department (HOSPITAL_COMMUNITY)
Admission: EM | Admit: 2011-05-18 | Discharge: 2011-05-19 | Disposition: A | Discharge status: Home / Self Care | Payer: BC Managed Care – PPO | Attending: Emergency Medicine | Admitting: Emergency Medicine

## 2011-05-18 DIAGNOSIS — D571 Sickle-cell disease without crisis: Secondary | ICD-10-CM | POA: Insufficient documentation

## 2011-05-18 DIAGNOSIS — M549 Dorsalgia, unspecified: Secondary | ICD-10-CM | POA: Insufficient documentation

## 2011-05-18 DIAGNOSIS — R079 Chest pain, unspecified: Secondary | ICD-10-CM | POA: Insufficient documentation

## 2011-05-19 LAB — DIFFERENTIAL
Basophils Absolute: 0 10*3/uL (ref 0.0–0.1)
Basophils Relative: 1 % (ref 0–1)
Eosinophils Absolute: 0.1 10*3/uL (ref 0.0–0.7)
Eosinophils Relative: 3 % (ref 0–5)
Lymphocytes Relative: 65 % — ABNORMAL HIGH (ref 12–46)
Lymphs Abs: 3.1 10*3/uL (ref 0.7–4.0)
Monocytes Absolute: 0.5 10*3/uL (ref 0.1–1.0)
Monocytes Relative: 10 % (ref 3–12)
Neutro Abs: 1 10*3/uL — ABNORMAL LOW (ref 1.7–7.7)
Neutrophils Relative %: 21 % — ABNORMAL LOW (ref 43–77)

## 2011-05-19 LAB — CBC
HCT: 34.4 % — ABNORMAL LOW (ref 36.0–46.0)
Hemoglobin: 11.6 g/dL — ABNORMAL LOW (ref 12.0–15.0)
MCH: 31.2 pg (ref 26.0–34.0)
MCHC: 33.7 g/dL (ref 30.0–36.0)
MCV: 92.5 fL (ref 78.0–100.0)
Platelets: 159 10*3/uL (ref 150–400)
RBC: 3.72 MIL/uL — ABNORMAL LOW (ref 3.87–5.11)
RDW: 13.2 % (ref 11.5–15.5)
WBC: 4.7 10*3/uL (ref 4.0–10.5)

## 2011-05-19 LAB — RETICULOCYTES
RBC.: 3.72 MIL/uL — ABNORMAL LOW (ref 3.87–5.11)
Retic Count, Absolute: 29.8 10*3/uL (ref 19.0–186.0)
Retic Ct Pct: 0.8 % (ref 0.4–3.1)

## 2011-05-19 LAB — COMPREHENSIVE METABOLIC PANEL
ALT: 9 U/L (ref 0–35)
AST: 12 U/L (ref 0–37)
Albumin: 4 g/dL (ref 3.5–5.2)
Alkaline Phosphatase: 68 U/L (ref 39–117)
BUN: 9 mg/dL (ref 6–23)
CO2: 29 mEq/L (ref 19–32)
Calcium: 9.3 mg/dL (ref 8.4–10.5)
Chloride: 103 mEq/L (ref 96–112)
Creatinine, Ser: 0.5 mg/dL (ref 0.50–1.10)
GFR calc Af Amer: >60 mL/min (ref >60)
GFR calc non Af Amer: >60 mL/min (ref >60)
Glucose, Bld: 64 mg/dL — ABNORMAL LOW (ref 70–99)
Potassium: 3.4 mEq/L — ABNORMAL LOW (ref 3.5–5.1)
Sodium: 140 mEq/L (ref 135–145)
Total Bilirubin: 0.1 mg/dL — ABNORMAL LOW (ref 0.3–1.2)
Total Protein: 7.3 g/dL (ref 6.0–8.3)

## 2011-05-19 LAB — URINALYSIS, ROUTINE W REFLEX MICROSCOPIC
Bilirubin Urine: NEGATIVE
Glucose, UA: NEGATIVE mg/dL
Hgb urine dipstick: NEGATIVE
Ketones, ur: NEGATIVE mg/dL
Leukocytes, UA: NEGATIVE
Nitrite: NEGATIVE
Protein, ur: NEGATIVE mg/dL
Specific Gravity, Urine: 1.018 (ref 1.005–1.030)
Urobilinogen, UA: 0.2 mg/dL (ref 0.0–1.0)
pH: 5.5 (ref 5.0–8.0)

## 2011-10-19 IMAGING — CR DG CHEST 2V
2 series · 2 of 2 positions shown · non-contrast
Comparison: 01/05/2011

CLINICAL DATA: Sickle cell crisis, chest pain

CHEST - 2 VIEW

[w chest pa]
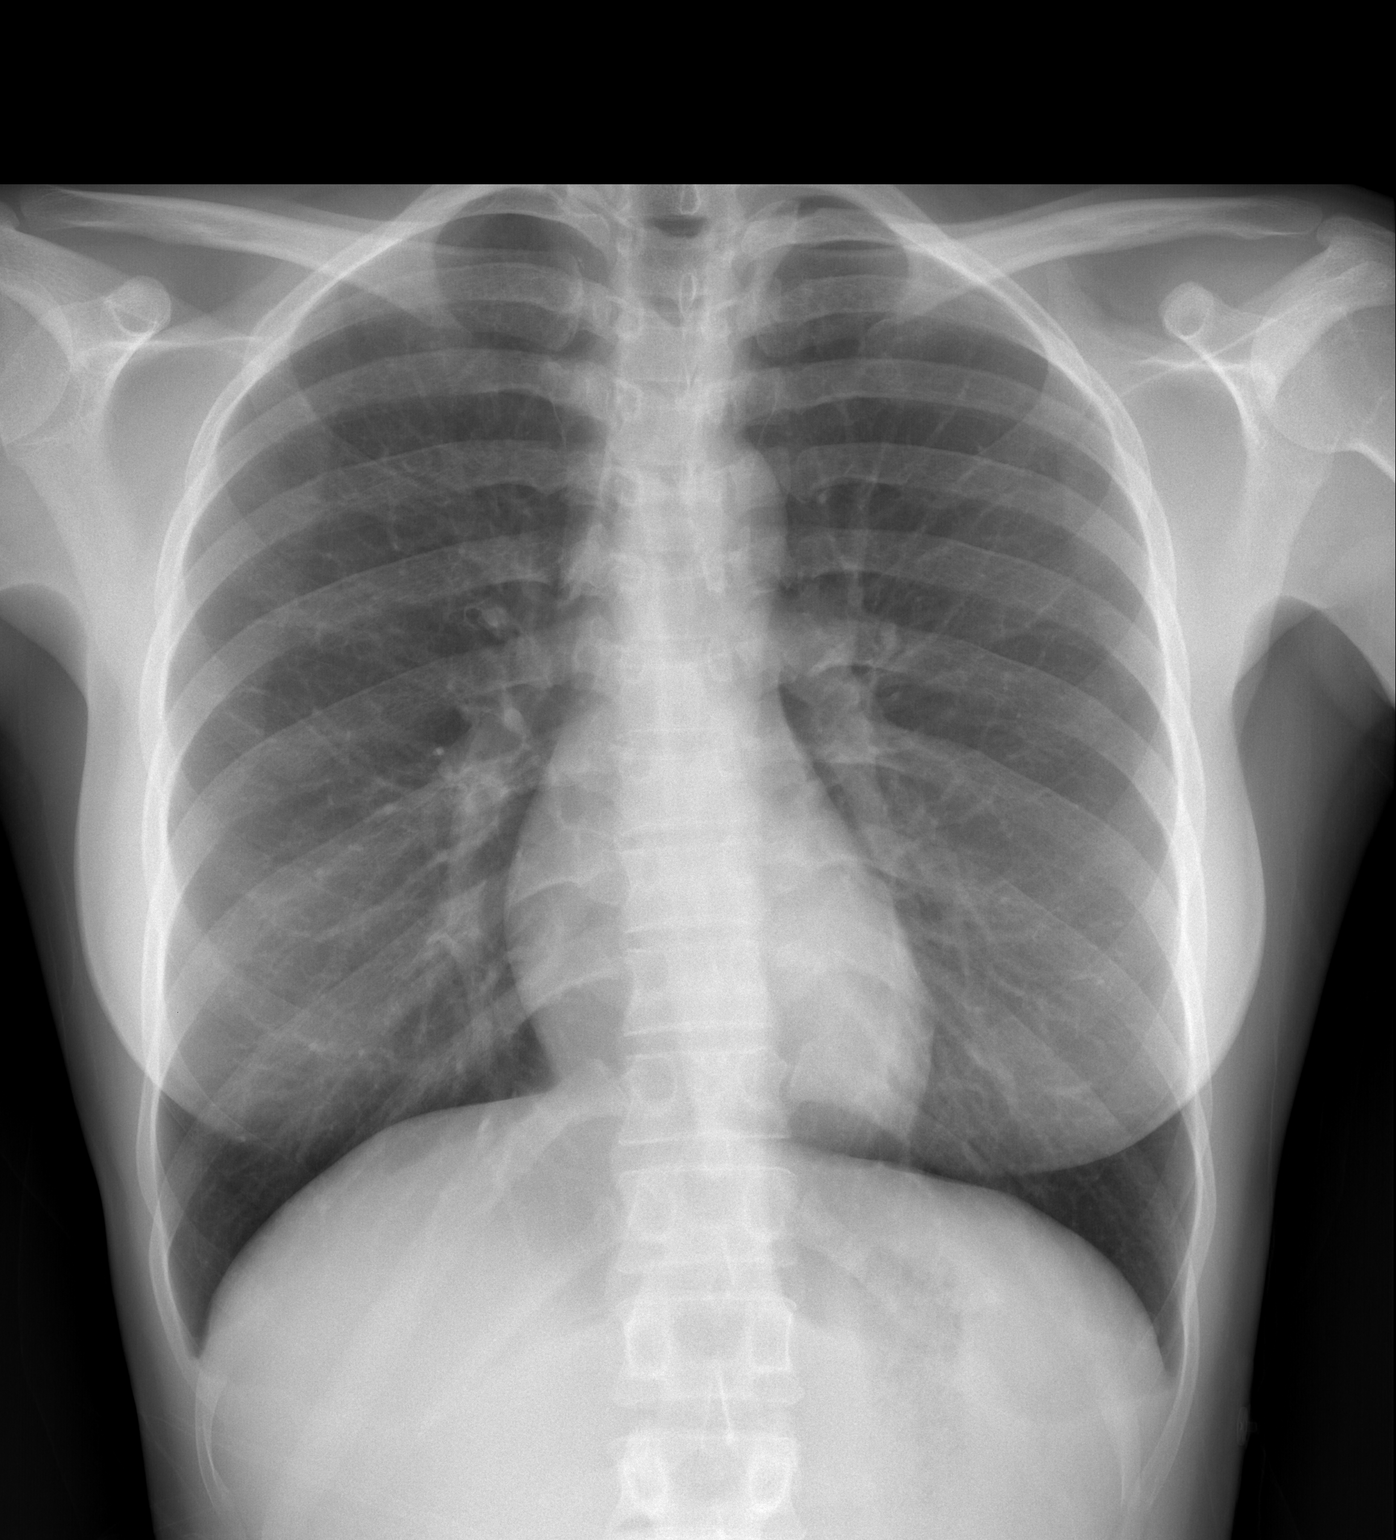

[w chest lat]
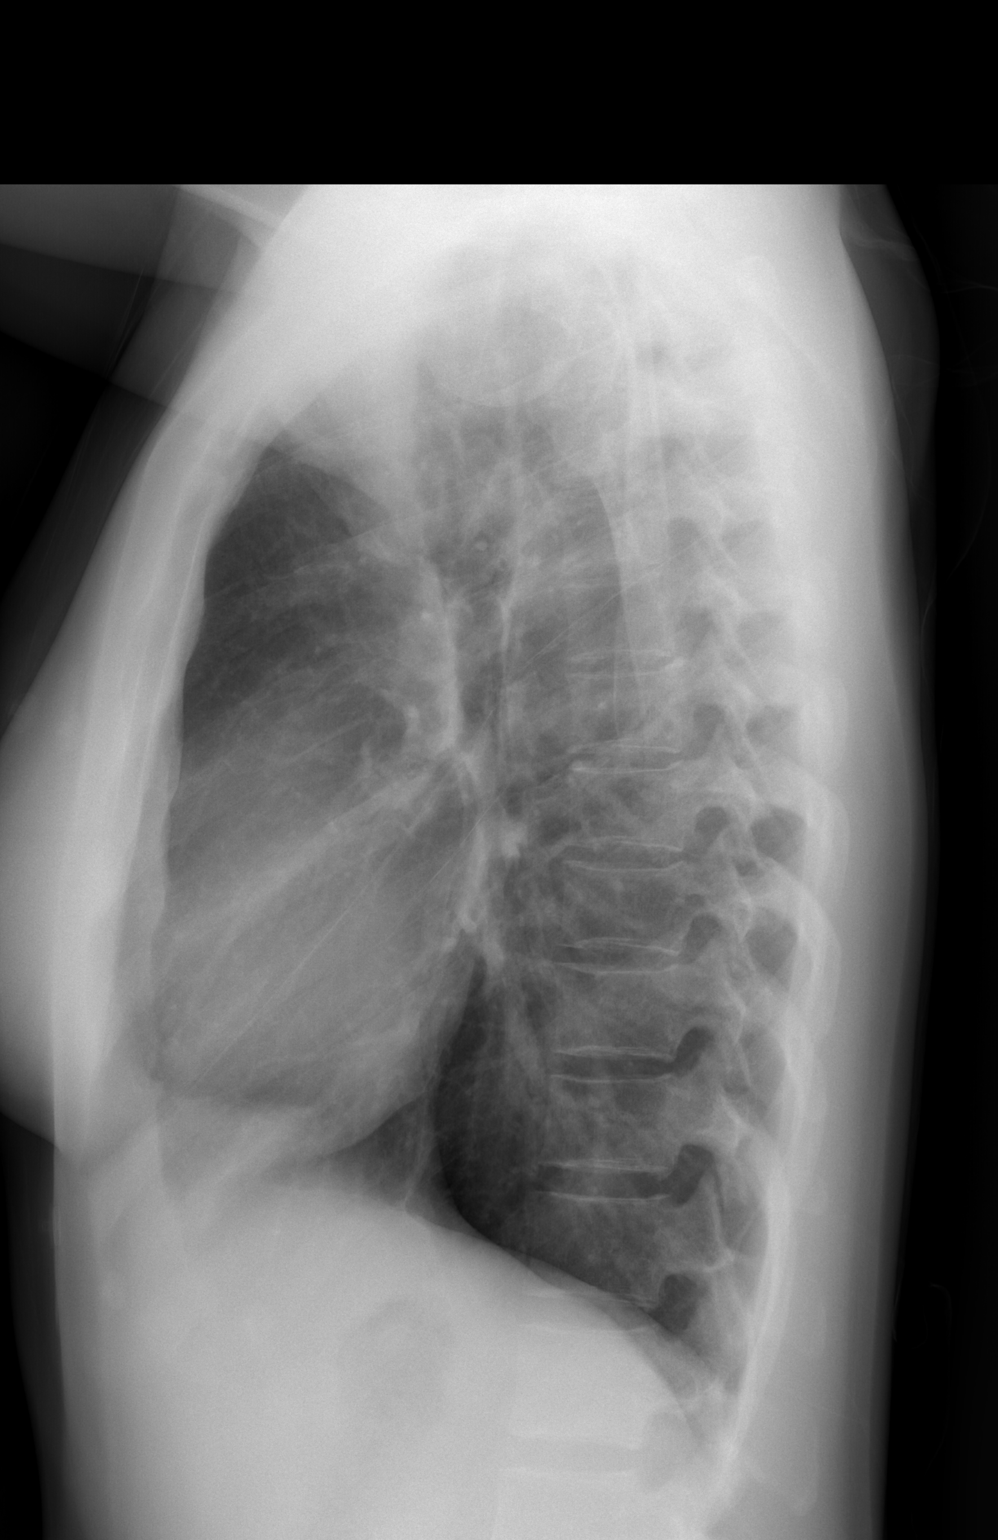

[2 of 2 positions shown; findings below may reference images not displayed]

FINDINGS: Lungs are clear. No pleural effusion or pneumothorax.

Cardiomediastinal silhouette is within normal limits.

Mild vertebral body endplate changes related to sickle cell
disease.
IMPRESSION: No evidence of acute cardiopulmonary disease.

## 2019-07-27 ENCOUNTER — Other Ambulatory Visit: Payer: Self-pay

## 2019-07-27 ENCOUNTER — Emergency Department (HOSPITAL_COMMUNITY)
Admission: EM | Admit: 2019-07-27 | Discharge: 2019-07-28 | Disposition: A | Discharge status: Home / Self Care | Payer: Medicaid Other | Attending: Emergency Medicine | Admitting: Emergency Medicine

## 2019-07-27 ENCOUNTER — Encounter (HOSPITAL_COMMUNITY): Payer: Self-pay | Admitting: Emergency Medicine

## 2019-07-27 DIAGNOSIS — D573 Sickle-cell trait: Secondary | ICD-10-CM | POA: Diagnosis not present

## 2019-07-27 DIAGNOSIS — Z91040 Latex allergy status: Secondary | ICD-10-CM | POA: Diagnosis not present

## 2019-07-27 DIAGNOSIS — F17290 Nicotine dependence, other tobacco product, uncomplicated: Secondary | ICD-10-CM | POA: Diagnosis not present

## 2019-07-27 DIAGNOSIS — Z765 Malingerer [conscious simulation]: Secondary | ICD-10-CM | POA: Diagnosis not present

## 2019-07-27 DIAGNOSIS — M545 Low back pain, unspecified: Secondary | ICD-10-CM

## 2019-07-27 DIAGNOSIS — Z79899 Other long term (current) drug therapy: Secondary | ICD-10-CM | POA: Diagnosis not present

## 2019-07-27 HISTORY — DX: Sickle-cell disease without crisis: D57.1

## 2019-07-27 NOTE — ED Triage Notes (Signed)
Pt reports having lower back pain along with leg pain similar to sickle cell pain.

## 2019-07-28 LAB — PROTIME-INR
INR: 1.1 (ref 0.8–1.2)
Prothrombin Time: 13.8 seconds (ref 11.4–15.2)

## 2019-07-28 LAB — COMPREHENSIVE METABOLIC PANEL
ALT: 11 U/L (ref 0–44)
AST: 42 U/L — ABNORMAL HIGH (ref 15–41)
Albumin: 4.2 g/dL (ref 3.5–5.0)
Alkaline Phosphatase: 58 U/L (ref 38–126)
Anion gap: 8 (ref 5–15)
BUN: 8 mg/dL (ref 6–20)
CO2: 27 mmol/L (ref 22–32)
Calcium: 9.3 mg/dL (ref 8.9–10.3)
Chloride: 103 mmol/L (ref 98–111)
Creatinine, Ser: 0.65 mg/dL (ref 0.44–1.00)
GFR calc Af Amer: >60 mL/min (ref >60)
GFR calc non Af Amer: >60 mL/min (ref >60)
Glucose, Bld: 92 mg/dL (ref 70–99)
Potassium: 5.1 mmol/L (ref 3.5–5.1)
Sodium: 138 mmol/L (ref 135–145)
Total Bilirubin: 1.2 mg/dL (ref 0.3–1.2)
Total Protein: 7.4 g/dL (ref 6.5–8.1)

## 2019-07-28 LAB — RETICULOCYTES
Immature Retic Fract: 7.8 % (ref 2.3–15.9)
RBC.: 3.69 MIL/uL — ABNORMAL LOW (ref 3.87–5.11)
Retic Count, Absolute: 41.3 10*3/uL (ref 19.0–186.0)
Retic Ct Pct: 1.1 % (ref 0.4–3.1)

## 2019-07-28 LAB — I-STAT BETA HCG BLOOD, ED (MC, WL, AP ONLY): I-stat hCG, quantitative: <5 m[IU]/mL (ref <5)

## 2019-07-28 LAB — CBC WITH DIFFERENTIAL/PLATELET
Abs Immature Granulocytes: 0.04 10*3/uL (ref 0.00–0.07)
Basophils Absolute: 0 10*3/uL (ref 0.0–0.1)
Basophils Relative: 1 %
Eosinophils Absolute: 0.2 10*3/uL (ref 0.0–0.5)
Eosinophils Relative: 3 %
HCT: 34.3 % — ABNORMAL LOW (ref 36.0–46.0)
Hemoglobin: 11.7 g/dL — ABNORMAL LOW (ref 12.0–15.0)
Immature Granulocytes: 1 %
Lymphocytes Relative: 41 %
Lymphs Abs: 2.4 10*3/uL (ref 0.7–4.0)
MCH: 31.7 pg (ref 26.0–34.0)
MCHC: 34.1 g/dL (ref 30.0–36.0)
MCV: 93 fL (ref 80.0–100.0)
Monocytes Absolute: 0.6 10*3/uL (ref 0.1–1.0)
Monocytes Relative: 10 %
Neutro Abs: 2.7 10*3/uL (ref 1.7–7.7)
Neutrophils Relative %: 44 %
Platelets: 159 10*3/uL (ref 150–400)
RBC: 3.69 MIL/uL — ABNORMAL LOW (ref 3.87–5.11)
RDW: 16.7 % — ABNORMAL HIGH (ref 11.5–15.5)
WBC: 6 10*3/uL (ref 4.0–10.5)
nRBC: 0 % (ref 0.0–0.2)

## 2019-07-28 MED ORDER — LEVOTHYROXINE SODIUM 25 MCG PO TABS
25.00 | ORAL_TABLET | ORAL | Status: DC
Start: 2019-07-27 — End: 2019-07-28

## 2019-07-28 MED ORDER — HYDROMORPHONE HCL 2 MG/ML IJ SOLN
0.50 | INTRAMUSCULAR | Status: DC
Start: ? — End: 2019-07-28

## 2019-07-28 MED ORDER — DEXTROSE-NACL 5-0.45 % IV SOLN
100.00 | INTRAVENOUS | Status: DC
Start: ? — End: 2019-07-28

## 2019-07-28 MED ORDER — ALBUTEROL SULFATE (2.5 MG/3ML) 0.083% IN NEBU
2.50 | INHALATION_SOLUTION | RESPIRATORY_TRACT | Status: DC
Start: ? — End: 2019-07-28

## 2019-07-28 MED ORDER — DIPHENHYDRAMINE HCL 50 MG/ML IJ SOLN
25.00 | INTRAMUSCULAR | Status: DC
Start: ? — End: 2019-07-28

## 2019-07-28 MED ORDER — KETOROLAC TROMETHAMINE 30 MG/ML IJ SOLN
30.0000 mg | Freq: Once | INTRAMUSCULAR | Status: AC
  Administered 2019-07-28: 30 mg via INTRAVENOUS
  Filled 2019-07-28: qty 1

## 2019-07-28 MED ORDER — ONDANSETRON HCL 4 MG/2ML IJ SOLN
4.0000 mg | Freq: Once | INTRAMUSCULAR | Status: AC
  Administered 2019-07-28: 4 mg via INTRAVENOUS
  Filled 2019-07-28: qty 2

## 2019-07-28 MED ORDER — LIDOCAINE 5 % EX OINT
TOPICAL_OINTMENT | CUTANEOUS | Status: DC
Start: ? — End: 2019-07-28

## 2019-07-28 NOTE — ED Provider Notes (Signed)
[facility]-EMERGENCY DEPT Provider Note   CSN: [id number] Arrival date & time: 07/27/19  2204     History   Chief Complaint Chief Complaint  Patient presents with   Sickle Cell Pain Crisis    HPI Sylvia Vega is a 33 y.o. female who presents to the emergency department with chief complaint of low back pain.  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  She states that her episodes typically start with pain in her low back and her pain today feels similar.  She characterizes the pain as aching and nonradiating.  Pain has been constant since it began suddenly earlier today.  She denies fever, chills, dysuria, hematuria, vaginal pain, bleeding, or itching, numbness, weakness, abdominal pain, chest pain, or shortness of breath.  States that she took 2 tablets of her home Percocet earlier today without improvement.  Reports her hematologist is at the [place] [facility].  States that she is in the area visiting family. She is requesting Benadryl, Phenergan, and Dilaudid for pain control.      The history is provided by the patient. No language interpreter was used.    Past Medical History:  Diagnosis Date   Sickle cell anemia (HCC)     There are no active problems to display for this patient.   History reviewed. No pertinent surgical history.   OB History   No obstetric history on file.      Home Medications    Prior to Admission medications   Medication Sig Start Date End Date Taking? Authorizing Provider  albuterol (VENTOLIN HFA) 108 (90 Base) MCG/ACT inhaler Inhale 2 puffs into the lungs every 4 (four) hours as needed for wheezing or shortness of breath.  11/04/09  Yes [provider]  cetirizine HCl (ZYRTEC) 5 MG/5ML SOLN Take 10 mg by mouth daily as needed for allergies.   Yes [provider]  clindamycin (CLINDAGEL) 1 % gel Apply 1 application topically 2 (two)  times daily.   Yes [provider]  desonide (DESOWEN) 0.05 % cream Apply 1 application topically 2 (two) times daily.   Yes [provider]  DULoxetine (CYMBALTA) 30 MG capsule Take 30 mg by mouth daily.  05/06/19  Yes [provider]  folic acid (FOLVITE) 1 MG tablet Take 1 mg by mouth daily.  02/23/10  Yes [provider]  ibuprofen (ADVIL) 800 MG tablet Take 800 mg by mouth every 8 (eight) hours as needed for moderate pain.  07/15/19  Yes [provider]  levothyroxine (SYNTHROID) 25 MCG tablet Take 25 mcg by mouth daily before breakfast.  02/09/19  Yes [provider]  metoprolol succinate (TOPROL-XL) 25 MG 24 hr tablet Take 25 mg by mouth daily.  02/03/15  Yes [provider]  omeprazole (PRILOSEC) 20 MG capsule Take 20 mg by mouth daily.  07/26/19 08/25/19 Yes [provider]  oxyCODONE-acetaminophen (PERCOCET) 10-325 MG tablet Take 1 tablet by mouth every 6 (six) hours as needed for pain.  02/09/18  Yes [provider]  promethazine (PHENERGAN) 6.25 MG/5ML syrup Take 6.25 mg by mouth every 8 (eight) hours as needed for nausea or vomiting.    Yes [provider]  tiZANidine (ZANAFLEX) 4 MG tablet Take 4 mg by mouth every 8 (eight) hours as needed for muscle spasms.    Yes [provider]  fluticasone (FLOVENT HFA) 110 MCG/ACT inhaler Inhale 1 puff into the lungs 2 (  two) times daily as needed (wheezing).     [provider]    Family History History reviewed. No pertinent family history.  Social History Social History   Tobacco Use   Smoking status: Current Every Day Smoker    Types: Cigars   Smokeless tobacco: Never Used  Substance Use Topics   Alcohol use: Yes    Frequency: Never   Drug use: Never     Allergies   Latex, Morphine and related, and Penicillins   Review of Systems Review of Systems  Constitutional: Negative for activity change, chills and fever.   Respiratory: Negative for shortness of breath.   Cardiovascular: Negative for chest pain.  Gastrointestinal: Negative for abdominal pain, diarrhea, nausea and vomiting.  Genitourinary: Negative for dysuria.  Musculoskeletal: Positive for back pain. Negative for gait problem, joint swelling, myalgias, neck pain and neck stiffness.  Skin: Negative for rash.  Allergic/Immunologic: Negative for immunocompromised state.  Neurological: Negative for seizures, syncope, weakness, numbness and headaches.  Psychiatric/Behavioral: Negative for confusion.     Physical Exam Updated Vital Signs BP 106/83    Pulse 89    Temp 98.6 F (37 C) (Oral)    Resp (!) 27    Ht 5\' 5"  (1.651 m)    Wt 59 kg    SpO2 100%    BMI 21.63 kg/m   Physical Exam Vitals signs and nursing note reviewed.  Constitutional:      General: She is not in acute distress.    Appearance: She is not ill-appearing, toxic-appearing or diaphoretic.  HENT:     Head: Normocephalic.  Eyes:     Conjunctiva/sclera: Conjunctivae normal.  Neck:     Musculoskeletal: Neck supple.  Cardiovascular:     Rate and Rhythm: Normal rate and regular rhythm.     Heart sounds: No murmur. No friction rub. No gallop.   Pulmonary:     Effort: Pulmonary effort is normal. No respiratory distress.     Breath sounds: No stridor. No wheezing, rhonchi or rales.  Chest:     Chest wall: No tenderness.  Abdominal:     General: There is no distension.     Palpations: Abdomen is soft. There is no mass.     Tenderness: There is no abdominal tenderness. There is no right CVA tenderness, left CVA tenderness, guarding or rebound.     Hernia: No hernia is present.     Comments: Abdomen is soft and nontender.  Musculoskeletal:     Right lower leg: No edema.     Left lower leg: No edema.     Comments: No tenderness to the cervical, thoracic, or lumbar spinous processes or bilateral paraspinal muscles.  She has neurovascularly intact to the bilateral lower  extremities with full active and passive range of motion of all joints in the bilateral lower extremities.  Skin:    General: Skin is warm.     Findings: No rash.  Neurological:     Mental Status: She is alert.  Psychiatric:        Behavior: Behavior normal.      ED Treatments / Results  Labs (all labs ordered are listed, but only abnormal results are displayed) Labs Reviewed  CBC WITH DIFFERENTIAL/PLATELET - Abnormal; Notable for the following components:      Result Value   RBC 3.69 (*)    Hemoglobin 11.7 (*)    HCT 34.3 (*)    RDW 16.7 (*)    All other components within normal limits  RETICULOCYTES - Abnormal; Notable for the following components:   RBC. 3.69 (*)    All other components within normal limits  COMPREHENSIVE METABOLIC PANEL - Abnormal; Notable for the following components:   AST 42 (*)    All other components within normal limits  PROTIME-INR  URINALYSIS, ROUTINE W REFLEX MICROSCOPIC  I-STAT BETA HCG BLOOD, ED ([facility], [facility], [facility] ONLY)    EKG None  Radiology No results found.  Procedures Procedures (including critical care time)  Medications Ordered in ED Medications  ketorolac (TORADOL) 30 MG/ML injection 30 mg (30 mg Intravenous Given 07/28/19 0136)  ondansetron (ZOFRAN) injection 4 mg (4 mg Intravenous Given 07/28/19 0136)     Initial Impression / Assessment and Plan / ED Course  I have reviewed the triage vital signs and the nursing notes.  Pertinent labs & imaging results that were available during my care of the patient were reviewed by me and considered in my medical decision making (see chart for details).        33 year old female who states that she has a history of beta thalassemia and is requesting Benadryl, Phenergan, and Dilaudid for sickle cell pain crisis.  She is new to our healthcare system, but on review of her medical record she has been seen at numerous ERs throughout multiple health care systems throughout the state.  States  [place] [facility] and is in town visiting family.  Nursing staff reports that the patient is requesting Benadryl, Phenergan, and Dilaudid for pain control.  Vital signs are normal.  Physical exam is unremarkable.  Hemoglobin is minimally decreased at 11.7.  Her reticulocyte panel is normal.  I also reviewed the patient's controlled substance database reporting for [place] [facility] [place] as well as multiple surrounding states.  She does not appear to have a monthly prescription of opioids and appears to get a couple of tablets at a time, likely from ER visits, which is concerning for drug-seeking behavior upon further review of her medical record, her results from 2 previous plasma [facility] were consistent with sickle cell trait.  The patient was given Toradol and Zofran for her symptoms while awaiting urine sample.  Notified by nursing staff that the patient was requesting more pain medication.  On reevaluation, I discussed with the patient my concern after reviewing her medical record and previous lab results.  She states "well if you aren't going to give me more pain medicine that I am going to leave."  I discussed with the patient that for thoroughness since she is having low back pain that we should also check a urinalysis.  Patient declines and is requesting discharge at this time.  At this time, I feel that no further urgent or emergent work-up is indicated.  ER return precautions given.  Safe for discharge to home at this time.  Final Clinical Impressions(s) / ED Diagnoses   Final diagnoses:  Sickle cell trait (HCC)  Bilateral low back pain without sciatica, unspecified chronicity  Drug-seeking behavior    ED Discharge Orders    None       Barkley BoardsMcDonald, Irbin Fines A, PA-C 07/28/19 0352    Nira Connardama, Pedro Eduardo, MD 07/28/19 0700

## 2019-07-28 NOTE — Discharge Instructions (Signed)
Thank you for allowing me to care for you today in the Emergency Department.   Follow-up with your primary care team at the [facility].  Return to the emergency department if you become unable to walk, develop high fevers with worsening back pain, persistent vomiting, if you pee or poop on yourself, or other new, concerning symptoms.

## 2024-02-10 ENCOUNTER — Emergency Department (HOSPITAL_COMMUNITY)

## 2024-02-10 ENCOUNTER — Other Ambulatory Visit: Payer: Self-pay

## 2024-02-10 ENCOUNTER — Encounter (HOSPITAL_COMMUNITY): Payer: Self-pay

## 2024-02-10 ENCOUNTER — Inpatient Hospital Stay (HOSPITAL_COMMUNITY)
Admission: EM | Admit: 2024-02-10 | Discharge: 2024-02-15 | DRG: 872 | Disposition: A | Discharge status: Home / Self Care | Attending: Internal Medicine | Admitting: Internal Medicine

## 2024-02-10 DIAGNOSIS — E039 Hypothyroidism, unspecified: Secondary | ICD-10-CM | POA: Diagnosis present

## 2024-02-10 DIAGNOSIS — R6520 Severe sepsis without septic shock: Secondary | ICD-10-CM | POA: Diagnosis present

## 2024-02-10 DIAGNOSIS — F418 Other specified anxiety disorders: Secondary | ICD-10-CM | POA: Diagnosis present

## 2024-02-10 DIAGNOSIS — Z1152 Encounter for screening for COVID-19: Secondary | ICD-10-CM

## 2024-02-10 DIAGNOSIS — Z79899 Other long term (current) drug therapy: Secondary | ICD-10-CM

## 2024-02-10 DIAGNOSIS — E162 Hypoglycemia, unspecified: Secondary | ICD-10-CM | POA: Diagnosis present

## 2024-02-10 DIAGNOSIS — R109 Unspecified abdominal pain: Secondary | ICD-10-CM | POA: Diagnosis not present

## 2024-02-10 DIAGNOSIS — F32A Depression, unspecified: Secondary | ICD-10-CM | POA: Diagnosis present

## 2024-02-10 DIAGNOSIS — F17290 Nicotine dependence, other tobacco product, uncomplicated: Secondary | ICD-10-CM | POA: Diagnosis present

## 2024-02-10 DIAGNOSIS — Z7951 Long term (current) use of inhaled steroids: Secondary | ICD-10-CM

## 2024-02-10 DIAGNOSIS — I10 Essential (primary) hypertension: Secondary | ICD-10-CM | POA: Diagnosis present

## 2024-02-10 DIAGNOSIS — A419 Sepsis, unspecified organism: Principal | ICD-10-CM | POA: Diagnosis present

## 2024-02-10 DIAGNOSIS — E876 Hypokalemia: Secondary | ICD-10-CM | POA: Diagnosis present

## 2024-02-10 DIAGNOSIS — Z885 Allergy status to narcotic agent status: Secondary | ICD-10-CM | POA: Diagnosis not present

## 2024-02-10 DIAGNOSIS — Z79891 Long term (current) use of opiate analgesic: Secondary | ICD-10-CM | POA: Diagnosis not present

## 2024-02-10 DIAGNOSIS — Z91040 Latex allergy status: Secondary | ICD-10-CM

## 2024-02-10 DIAGNOSIS — N10 Acute tubulo-interstitial nephritis: Secondary | ICD-10-CM | POA: Diagnosis present

## 2024-02-10 DIAGNOSIS — Z880 Allergy status to penicillin: Secondary | ICD-10-CM

## 2024-02-10 DIAGNOSIS — D571 Sickle-cell disease without crisis: Secondary | ICD-10-CM | POA: Diagnosis present

## 2024-02-10 DIAGNOSIS — N12 Tubulo-interstitial nephritis, not specified as acute or chronic: Secondary | ICD-10-CM | POA: Diagnosis present

## 2024-02-10 DIAGNOSIS — Z79890 Hormone replacement therapy (postmenopausal): Secondary | ICD-10-CM | POA: Diagnosis not present

## 2024-02-10 HISTORY — DX: Sickle-cell trait: D57.3

## 2024-02-10 HISTORY — DX: Essential (primary) hypertension: I10

## 2024-02-10 LAB — MAGNESIUM: Magnesium: 2 mg/dL (ref 1.7–2.4)

## 2024-02-10 LAB — CBC WITH DIFFERENTIAL/PLATELET
Abs Immature Granulocytes: 0.05 10*3/uL (ref 0.00–0.07)
Basophils Absolute: 0 10*3/uL (ref 0.0–0.1)
Basophils Relative: 0 %
Eosinophils Absolute: 0.1 10*3/uL (ref 0.0–0.5)
Eosinophils Relative: 1 %
HCT: 36 % (ref 36.0–46.0)
Hemoglobin: 12.4 g/dL (ref 12.0–15.0)
Immature Granulocytes: 0 %
Lymphocytes Relative: 10 %
Lymphs Abs: 1.3 10*3/uL (ref 0.7–4.0)
MCH: 32.6 pg (ref 26.0–34.0)
MCHC: 34.4 g/dL (ref 30.0–36.0)
MCV: 94.7 fL (ref 80.0–100.0)
Monocytes Absolute: 0.5 10*3/uL (ref 0.1–1.0)
Monocytes Relative: 4 %
Neutro Abs: 10.5 10*3/uL — ABNORMAL HIGH (ref 1.7–7.7)
Neutrophils Relative %: 85 %
Platelets: 171 10*3/uL (ref 150–400)
RBC: 3.8 MIL/uL — ABNORMAL LOW (ref 3.87–5.11)
RDW: 14.2 % (ref 11.5–15.5)
WBC: 12.4 10*3/uL — ABNORMAL HIGH (ref 4.0–10.5)
nRBC: 0 % (ref 0.0–0.2)

## 2024-02-10 LAB — URINALYSIS, W/ REFLEX TO CULTURE (INFECTION SUSPECTED)
Bilirubin Urine: NEGATIVE
Glucose, UA: NEGATIVE mg/dL
Ketones, ur: NEGATIVE mg/dL
Nitrite: POSITIVE — AB
Protein, ur: 100 mg/dL — AB
Specific Gravity, Urine: 1.008 (ref 1.005–1.030)
WBC, UA: >50 WBC/hpf (ref 0–5)
pH: 6 (ref 5.0–8.0)

## 2024-02-10 LAB — RESP PANEL BY RT-PCR (RSV, FLU A&B, COVID)  RVPGX2
Influenza A by PCR: NEGATIVE
Influenza B by PCR: NEGATIVE
Resp Syncytial Virus by PCR: NEGATIVE
SARS Coronavirus 2 by RT PCR: NEGATIVE

## 2024-02-10 LAB — COMPREHENSIVE METABOLIC PANEL WITH GFR
ALT: 10 U/L (ref 0–44)
AST: 16 U/L (ref 15–41)
Albumin: 4.2 g/dL (ref 3.5–5.0)
Alkaline Phosphatase: 81 U/L (ref 38–126)
Anion gap: 10 (ref 5–15)
BUN: 9 mg/dL (ref 6–20)
CO2: 26 mmol/L (ref 22–32)
Calcium: 9.2 mg/dL (ref 8.9–10.3)
Chloride: 102 mmol/L (ref 98–111)
Creatinine, Ser: 0.67 mg/dL (ref 0.44–1.00)
GFR, Estimated: >60 mL/min (ref >60)
Glucose, Bld: 69 mg/dL — ABNORMAL LOW (ref 70–99)
Potassium: 3.3 mmol/L — ABNORMAL LOW (ref 3.5–5.1)
Sodium: 138 mmol/L (ref 135–145)
Total Bilirubin: 0.2 mg/dL (ref 0.0–1.2)
Total Protein: 7.8 g/dL (ref 6.5–8.1)

## 2024-02-10 LAB — I-STAT CG4 LACTIC ACID, ED
Lactic Acid, Venous: 1.6 mmol/L (ref 0.5–1.9)
Lactic Acid, Venous: 2.8 mmol/L (ref 0.5–1.9)

## 2024-02-10 LAB — HCG, SERUM, QUALITATIVE: Preg, Serum: NEGATIVE

## 2024-02-10 LAB — PROTIME-INR
INR: 1 (ref 0.8–1.2)
Prothrombin Time: 13.6 s (ref 11.4–15.2)

## 2024-02-10 MED ORDER — LACTATED RINGERS IV BOLUS
1000.0000 mL | Freq: Once | INTRAVENOUS | Status: AC
  Administered 2024-02-10: 1000 mL via INTRAVENOUS

## 2024-02-10 MED ORDER — ONDANSETRON HCL 4 MG/2ML IJ SOLN
4.0000 mg | Freq: Once | INTRAMUSCULAR | Status: AC
Start: 2024-02-10 — End: 2024-02-10
  Administered 2024-02-10: 4 mg via INTRAVENOUS
  Filled 2024-02-10: qty 2

## 2024-02-10 MED ORDER — DEXTROSE 50 % IV SOLN: 1.0000 | INTRAVENOUS | Status: DC | PRN

## 2024-02-10 MED ORDER — ACETAMINOPHEN 500 MG PO TABS
1000.0000 mg | ORAL_TABLET | Freq: Once | ORAL | Status: AC
  Administered 2024-02-10: 1000 mg via ORAL
  Filled 2024-02-10: qty 2

## 2024-02-10 MED ORDER — ACETAMINOPHEN 325 MG PO TABS: 650.0000 mg | ORAL_TABLET | Freq: Four times a day (QID) | ORAL | Status: DC | PRN

## 2024-02-10 MED ORDER — LEVOTHYROXINE SODIUM 25 MCG PO TABS: 25.0000 ug | ORAL_TABLET | Freq: Every day | ORAL | Status: DC

## 2024-02-10 MED ORDER — ACETAMINOPHEN 650 MG RE SUPP
650.0000 mg | Freq: Once | RECTAL | Status: DC
  Filled 2024-02-10: qty 1

## 2024-02-10 MED ORDER — DIPHENHYDRAMINE HCL 50 MG/ML IJ SOLN
25.0000 mg | Freq: Once | INTRAMUSCULAR | Status: AC
  Administered 2024-02-10: 25 mg via INTRAVENOUS
  Filled 2024-02-10: qty 1

## 2024-02-10 MED ORDER — LACTATED RINGERS IV SOLN: INTRAVENOUS | Status: AC

## 2024-02-10 MED ORDER — SODIUM CHLORIDE 0.9 % IV SOLN: 1.0000 g | INTRAVENOUS | Status: DC

## 2024-02-10 MED ORDER — HYDROMORPHONE HCL 1 MG/ML IJ SOLN
1.0000 mg | Freq: Once | INTRAMUSCULAR | Status: AC
  Administered 2024-02-10: 1 mg via INTRAVENOUS
  Filled 2024-02-10: qty 1

## 2024-02-10 MED ORDER — POTASSIUM CHLORIDE 10 MEQ/100ML IV SOLN
10.0000 meq | INTRAVENOUS | Status: AC
  Administered 2024-02-10 – 2024-02-11 (×2): 10 meq via INTRAVENOUS
  Filled 2024-02-10 (×2): qty 100

## 2024-02-10 MED ORDER — HYDROMORPHONE HCL 1 MG/ML IJ SOLN
1.0000 mg | INTRAMUSCULAR | Status: DC | PRN
  Administered 2024-02-11 (×5): 1 mg via INTRAVENOUS
  Filled 2024-02-10 (×7): qty 1

## 2024-02-10 MED ORDER — MELATONIN 3 MG PO TABS: 3.0000 mg | ORAL_TABLET | Freq: Every evening | ORAL | Status: DC | PRN

## 2024-02-10 MED ORDER — SODIUM CHLORIDE 0.9 % IV SOLN
2.0000 g | Freq: Once | INTRAVENOUS | Status: AC
  Administered 2024-02-10: 2 g via INTRAVENOUS
  Filled 2024-02-10: qty 20

## 2024-02-10 MED ORDER — METOPROLOL SUCCINATE ER 25 MG PO TB24
25.0000 mg | ORAL_TABLET | Freq: Every day | ORAL | Status: DC
  Administered 2024-02-12 – 2024-02-15 (×4): 25 mg via ORAL
  Filled 2024-02-10 (×5): qty 1

## 2024-02-10 MED ORDER — ACETAMINOPHEN 650 MG RE SUPP: 650.0000 mg | Freq: Four times a day (QID) | RECTAL | Status: DC | PRN

## 2024-02-10 MED ORDER — DULOXETINE HCL 30 MG PO CPEP: 30.0000 mg | ORAL_CAPSULE | Freq: Every day | ORAL | Status: DC

## 2024-02-10 MED ORDER — ACETAMINOPHEN 500 MG PO TABS
1000.0000 mg | ORAL_TABLET | Freq: Once | ORAL | Status: DC
  Filled 2024-02-10: qty 2

## 2024-02-10 MED ORDER — NALOXONE HCL 0.4 MG/ML IJ SOLN: 0.4000 mg | INTRAMUSCULAR | Status: DC | PRN

## 2024-02-10 MED ORDER — LORAZEPAM 2 MG/ML IJ SOLN: 1.0000 mg | Freq: Four times a day (QID) | INTRAMUSCULAR | Status: DC | PRN

## 2024-02-10 MED ORDER — ONDANSETRON HCL 4 MG/2ML IJ SOLN
4.0000 mg | Freq: Four times a day (QID) | INTRAMUSCULAR | Status: DC | PRN
  Administered 2024-02-11: 4 mg via INTRAVENOUS
  Filled 2024-02-10: qty 2

## 2024-02-10 NOTE — ED Notes (Signed)
 Patient transported to XR.

## 2024-02-10 NOTE — ED Notes (Signed)
 Istat lactic acid 1.55 did not cross over into epic.

## 2024-02-10 NOTE — ED Notes (Signed)
 Patient transported to CT

## 2024-02-10 NOTE — ED Provider Notes (Signed)
 [place] [facility]PARTMENT AT [facility] Provider Note   CSN: [id number] Arrival date & time: 02/10/24  1952     History {Add pertinent medical, surgical, social history, OB history to HPI:1} Chief Complaint  Patient presents with   Flank Pain    Sylvia Vega is a 38 y.o. female with PMH as listed below who presents BIBA from home, c/o severe bilateral R>L flank pain that started earlier today, no history of kidney stone. A/w abdominal pain and nausea/vomiting. Also w dark urine, ammonia smell. Hx of sickle cell trait. A/Ox4  Took percocet 15 mg about 2 hours ago but it didn't help at all. Denies CP, SOB. Febrile to 104.5 F on arrival, tachycardic into 130s, RR 30.    Past Medical History:  Diagnosis Date   Sickle cell anemia (HCC)        Home Medications Prior to Admission medications   Medication Sig Start Date End Date Taking? Authorizing Provider  albuterol  (VENTOLIN  HFA) 108 (90 Base) MCG/ACT inhaler Inhale 2 puffs into the lungs every 4 (four) hours as needed for wheezing or shortness of breath.  11/04/09   [provider]  cetirizine HCl (ZYRTEC) 5 MG/5ML SOLN Take 10 mg by mouth daily as needed for allergies.    [provider]  clindamycin (CLINDAGEL) 1 % gel Apply 1 application topically 2 (two) times daily.    [provider]  desonide (DESOWEN) 0.05 % cream Apply 1 application topically 2 (two) times daily.    [provider]  DULoxetine (CYMBALTA) 30 MG capsule Take 30 mg by mouth daily.  05/06/19   [provider]  fluticasone (FLOVENT HFA) 110 MCG/ACT inhaler Inhale 1 puff into the lungs 2 (two) times daily as needed (wheezing).     [provider]  folic acid (FOLVITE) 1 MG tablet Take 1 mg by mouth daily.  02/23/10   [provider]  ibuprofen (ADVIL) 800 MG tablet Take 800 mg by mouth every 8 (eight) hours as needed for moderate pain.  07/15/19   [provider]  levothyroxine   (SYNTHROID ) 25 MCG tablet Take 25 mcg by mouth daily before breakfast.  02/09/19   [provider]  metoprolol succinate (TOPROL-XL) 25 MG 24 hr tablet Take 25 mg by mouth daily.  02/03/15   [provider]  omeprazole (PRILOSEC) 20 MG capsule Take 20 mg by mouth daily.  07/26/19 08/25/19  [provider]  oxyCODONE-acetaminophen (PERCOCET) 10-325 MG tablet Take 1 tablet by mouth every 6 (six) hours as needed for pain.  02/09/18   [provider]  promethazine (PHENERGAN) 6.25 MG/5ML syrup Take 6.25 mg by mouth every 8 (eight) hours as needed for nausea or vomiting.     [provider]  tiZANidine (ZANAFLEX) 4 MG tablet Take 4 mg by mouth every 8 (eight) hours as needed for muscle spasms.     [provider]      Allergies    Latex, Morphine and codeine, and Penicillins    Review of Systems   Review of Systems A 10 point review of systems was performed and is negative unless otherwise reported in HPI.  Physical Exam Updated Vital Signs BP 137/87   Pulse (!) 139   Temp (!) 104.5 F (40.3 C) (Oral)   Resp (!) 21   Ht 5\' 5"  (1.651 m)   Wt 72.6 kg   SpO2 93%   BMI 26.63 kg/m  Physical Exam General: Very uncomfortable appearing female, lying in  bed.  HEENT: PERRLA, Sclera anicteric, MMM, trachea midline.  Cardiology: Regular tachycardic rate, no murmurs/rubs/gallops.  Resp: Normal respiratory rate and effort. CTAB, no wheezes, rhonchi, crackles.  Abd: Soft, +Diffuse abd TTP, non-distended. No rebound tenderness or guarding.  GU: Deferred. MSK: No peripheral edema or signs of trauma. Extremities without deformity or TTP. No cyanosis or clubbing. Skin: warm, dry.  Back: +R>L CVA TTP Neuro: A&Ox4, CNs II-XII grossly intact. MAEs. Sensation grossly intact.  Psych: Normal mood and affect.   ED Results / Procedures / Treatments   Labs (all labs ordered are listed, but only abnormal results are displayed) Labs Reviewed  COMPREHENSIVE  METABOLIC PANEL WITH GFR - Abnormal; Notable for the following components:      Result Value   Potassium 3.3 (*)    Glucose, Bld 69 (*)    All other components within normal limits  CBC WITH DIFFERENTIAL/PLATELET - Abnormal; Notable for the following components:   WBC 12.4 (*)    RBC 3.80 (*)    Neutro Abs 10.5 (*)    All other components within normal limits  RESP PANEL BY RT-PCR (RSV, FLU A&B, COVID)  [id number]  CULTURE, BLOOD (ROUTINE X 2)  CULTURE, BLOOD (ROUTINE X 2)  PROTIME-INR  HCG, SERUM, QUALITATIVE  URINALYSIS, W/ REFLEX TO CULTURE (INFECTION SUSPECTED)  I-STAT CG4 LACTIC ACID, ED  I-STAT CG4 LACTIC ACID, ED    EKG None  Radiology DG Chest 2 View Result Date: 02/10/2024 CLINICAL DATA:  Suspected sepsis, flank pain EXAM: CHEST - 2 VIEW COMPARISON:  05/19/2011 FINDINGS: The heart size and mediastinal contours are within normal limits. Both lungs are clear. The visualized skeletal structures are unremarkable. IMPRESSION: No active cardiopulmonary disease. Electronically Signed   By: Janeece Mechanic M.D.   On: 02/10/2024 20:31    Procedures .Critical Care  Performed by: Merdis Stalling, MD Authorized by: Merdis Stalling, MD   Critical care provider statement:    Critical care time (minutes):  35   Critical care was necessary to treat or prevent imminent or life-threatening deterioration of the following conditions:  Sepsis   Critical care was time spent personally by me on the following activities:  Development of treatment plan with patient or surrogate, discussions with consultants, evaluation of patient's response to treatment, examination of patient, ordering and review of laboratory studies, ordering and review of radiographic studies, ordering and performing treatments and interventions, pulse oximetry, re-evaluation of patient's condition, review of old charts and obtaining history from patient or surrogate   Care discussed with: admitting provider     {Document cardiac  monitor, telemetry assessment procedure when appropriate:1}  Medications Ordered in ED Medications  lactated ringers infusion ( Intravenous New Bag/Given 02/10/24 2135)  acetaminophen (TYLENOL) suppository 650 mg (has no administration in time range)  HYDROmorphone  (DILAUDID ) injection 1 mg (has no administration in time range)  cefTRIAXone (ROCEPHIN) 2 g in sodium chloride 0.9 % 100 mL IVPB (0 g Intravenous Stopped 02/10/24 2114)  lactated ringers bolus 1,000 mL (1,000 mLs Intravenous New Bag/Given 02/10/24 2045)  HYDROmorphone  (DILAUDID ) injection 1 mg (1 mg Intravenous Given 02/10/24 2041)  ondansetron  (ZOFRAN ) injection 4 mg (4 mg Intravenous Given 02/10/24 2041)  diphenhydrAMINE  (BENADRYL ) injection 25 mg (25 mg Intravenous Given 02/10/24 2058)    ED Course/ Medical Decision Making/ A&P                          Medical Decision Making Amount and/or Complexity of Data  Reviewed Labs: ordered. Decision-making details documented in ED Course. Radiology: ordered. Decision-making details documented in ED Course.  Risk OTC drugs. Prescription drug management.    This patient presents to the ED for concern of flank pain, fever, this involves an extensive number of treatment options, and is a complaint that carries with it a high risk of complications and morbidity.  I considered the following differential and admission for this acute, potentially life threatening condition.   MDM:    Greatest concern for septic renal stone, pyelonephritis, other UTI. Appears on chart review that she has sickle cell trait as opposed to sickle cell disease, no c/f sickle cell crisis. Per chart review of care everywhere, patient presented to OSH many times last year for abdominal pain and had prior PEG tube. Code sepsis called, ordered blood cultures, fluids, abx. ***  Clinical Course as of 02/10/24 2210  Sat Feb 10, 2024  2046 DG Chest 2 View No active cardiopulmonary disease. [HN]  2132 WBC(!):  12.4 +leukocytosis w/ left shift [HN]  2133 Glucose(!): 69 Very mild hypoglycemia [HN]  2208 Resp panel by RT-PCR (RSV, Flu A&B, Covid) Anterior Nasal Swab neg [HN]    Clinical Course User Index [HN] Merdis Stalling, MD    Labs: I Ordered, and personally interpreted labs.  The pertinent results include:  those listed above  Imaging Studies ordered: I ordered imaging studies including CT renal stone study I independently visualized and interpreted imaging. I agree with the radiologist interpretation  Additional history obtained from chart review.  External records from outside source obtained and reviewed  Cardiac Monitoring: The patient was maintained on a cardiac monitor.  I personally viewed and interpreted the cardiac monitored which showed an underlying rhythm of: sinus tachycardia  Reevaluation: After the interventions noted above, I reevaluated the patient and found that they have :improved  Social Determinants of Health: Lives independently  Disposition:  ***  Co morbidities that complicate the patient evaluation  Past Medical History:  Diagnosis Date   Sickle cell anemia (HCC)      Medicines Meds ordered this encounter  Medications   lactated ringers infusion   cefTRIAXone (ROCEPHIN) 2 g in sodium chloride 0.9 % 100 mL IVPB    Antibiotic Indication::   UTI   lactated ringers bolus 1,000 mL   HYDROmorphone  (DILAUDID ) injection 1 mg   ondansetron  (ZOFRAN ) injection 4 mg   DISCONTD: acetaminophen (TYLENOL) tablet 1,000 mg   diphenhydrAMINE  (BENADRYL ) injection 25 mg   acetaminophen (TYLENOL) suppository 650 mg   HYDROmorphone  (DILAUDID ) injection 1 mg    I have reviewed the patients home medicines and have made adjustments as needed  Problem List / ED Course: Problem List Items Addressed This Visit   None        {Document critical care time when appropriate:1} {Document review of labs and clinical decision tools ie heart score, Chads2Vasc2 etc:1}   {Document your independent review of radiology images, and any outside records:1} {Document your discussion with family members, caretakers, and with consultants:1} {Document social determinants of health affecting pt's care:1} {Document your decision making why or why not admission, treatments were needed:1}  This note was created using dictation software, which may contain spelling or grammatical errors.

## 2024-02-10 NOTE — ED Notes (Signed)
 RN attempted IV unsuccessful, orders put in for IV team consult.

## 2024-02-10 NOTE — ED Notes (Signed)
 First set of cultures collected at 2037

## 2024-02-10 NOTE — ED Triage Notes (Signed)
 Pt BIBA from home, c/o flank pain, no history of kidney stone. Dark urine, ammonia smell. Hx of tachycardia and sickle cell. A/Ox4  Took percocet 15 about 2 hours ago.

## 2024-02-10 NOTE — H&P (Incomplete)
 History and Physical      Sylvia Vega RUE:[id number] DOB: 1986-03-03 DOA: 02/10/2024; DOS: 02/10/2024  PCP: Pcp, No (will further assess) Patient coming from: home   I have personally briefly reviewed patient's old medical records in [facility]  Chief Complaint: Right flank pain  HPI: Sylvia Vega is a 38 y.o. female with medical history significant for sickle cell trait, acquired hypothyroidism, who is admitted to [facility] on 02/10/2024 with severe sepsis due to right-sided pyelonephritis after presenting from home to [facility] ED complaining of right flank discomfort.   The patient reports to 3 days of persistent sharp, nonradiating right flank discomfort associated with new dark coloration of her urine as well as dysuria.  She also notes associated subjective fever/chills over that timeframe, in the absence of full body rigors or generalized myalgias.  This is also been associated with intermittent nausea resulting in at least 2-3 episodes of nonbloody, nonbilious emesis.  Not associate with any diarrhea, neck stiffness, cough, shortness of breath.  Denies any associated chest pain, diaphoresis.  In the context of her intermittent nausea over the last 2 to 3 days, patient reports significant decline in oral intake over that timeframe.  her medical history is notable for sickle cell trait,confirmed per documentation acquired via chart review.  No known history of diabetes.  The patient conveys that historically, phenergan is a more effective antiemetic agent for her relative to Zofran .   ED Course:  Vital signs in the ED were notable for the following: Temperature max 104.5, which occurred prior to administration of acetaminophen in the ED; rates in the 120s to 130s associated sinus tachycardia; systolic blood pressures in the 130s to 150s; respiratory rate 15-29, oxygen saturation 93 to 100% on room air.  Labs were notable for the following: CMP was notable for the following:  Sodium 138, potassium 3.3, bicarbonate 26, anion gap 10, creatinine 0.67, glucose 69, liver enzymes within normal limits.  Lactic acid 2.8, with repeat currently pending.  CBC notable for white cell count 12,400 with 85% neutrophils, hemoglobin 12.4, bili 171.  INR 1.0.  Serum pregnancy test negative.  Urinalysis was associate with a hazy appearing specimen and notable for greater than 50 white blood cells, large leukocyte esterase, positive nitrates and evidence discussed epithelial cells while showing 21-50 RBCs.  Blood cultures x 2 and urine culture were collected prior to initiation of IV antibiotics.  COVID, influenza, RSV PCR were all negative.  Per my interpretation, EKG in ED demonstrated the following: No EKG performed in the ED today.  Imaging in the ED, per corresponding formal radiology read, was notable for the following: CT renal stone study showed prominence of the mid and proximal right ureter with surrounding inflammatory stranding concerning for infection, will demonstrate no evidence of ureteral stone or additional acute intra-abdominal or acute intrapelvic process.  2 view chest x-ray showed no evidence of acute cardiopulmonary process, including no evidence of infiltrate, edema, effusion, or pneumothorax.  While in the ED, the following were administered: Acetaminophen 1 g p.o. x 1 dose, Benadryl  25 mg IV x 1 dose, Dilaudid  1 mg IV x 1 dose, Zofran  4 mg IV x 1, Rocephin, 2 L LR bolus followed by continuous LR running at 150 cc/h x 20 hours.  Subsequently, the patient was admitted for further evaluation management of presenting severe sepsis due to right-sided pyelonephritis, with presenting labs also notable for hypokalemia as well as mild hypoglycemia.    Review of Systems: As per HPI  otherwise 10 point review of systems negative.   Past Medical History:  Diagnosis Date   Essential hypertension    Sickle cell anemia (HCC)    Sickle cell trait (HCC)     History reviewed. No  pertinent surgical history.  Social History:  reports that she has been smoking cigars. She has never used smokeless tobacco. She reports current alcohol use. She reports that she does not use drugs.   Allergies  Allergen Reactions   Latex Hives   Morphine And Codeine Hives    Elevated heart rate   Penicillins Hives    Yeast infection Did it involve swelling of the face/tongue/throat, SOB, or low BP? No Did it involve sudden or severe rash/hives, skin peeling, or any reaction on the inside of your mouth or nose? No Did you need to seek medical attention at a hospital or doctor's office? No When did it last happen?       If all above answers are "NO", may proceed with cephalosporin use.     History reviewed. No pertinent family history.   Prior to Admission medications   Medication Sig Start Date End Date Taking? Authorizing Provider  albuterol  (VENTOLIN  HFA) 108 (90 Base) MCG/ACT inhaler Inhale 2 puffs into the lungs every 4 (four) hours as needed for wheezing or shortness of breath.  11/04/09   [provider]  cetirizine HCl (ZYRTEC) 5 MG/5ML SOLN Take 10 mg by mouth daily as needed for allergies.    [provider]  clindamycin (CLINDAGEL) 1 % gel Apply 1 application topically 2 (two) times daily.    [provider]  desonide (DESOWEN) 0.05 % cream Apply 1 application topically 2 (two) times daily.    [provider]  DULoxetine (CYMBALTA) 30 MG capsule Take 30 mg by mouth daily.  05/06/19   [provider]  fluticasone (FLOVENT HFA) 110 MCG/ACT inhaler Inhale 1 puff into the lungs 2 (two) times daily as needed (wheezing).     [provider]  folic acid (FOLVITE) 1 MG tablet Take 1 mg by mouth daily.  02/23/10   [provider]  ibuprofen (ADVIL) 800 MG tablet Take 800 mg by mouth every 8 (eight) hours as needed for moderate pain.  07/15/19   [provider]  levothyroxine  (SYNTHROID ) 25 MCG tablet Take 25 mcg by  mouth daily before breakfast.  02/09/19   [provider]  metoprolol succinate (TOPROL-XL) 25 MG 24 hr tablet Take 25 mg by mouth daily.  02/03/15   [provider]  omeprazole (PRILOSEC) 20 MG capsule Take 20 mg by mouth daily.  07/26/19 08/25/19  [provider]  oxyCODONE-acetaminophen (PERCOCET) 10-325 MG tablet Take 1 tablet by mouth every 6 (six) hours as needed for pain.  02/09/18   [provider]  promethazine (PHENERGAN) 6.25 MG/5ML syrup Take 6.25 mg by mouth every 8 (eight) hours as needed for nausea or vomiting.     [provider]  tiZANidine (ZANAFLEX) 4 MG tablet Take 4 mg by mouth every 8 (eight) hours as needed for muscle spasms.     [provider]     Objective    Physical Exam: Vitals:   02/10/24 2100 02/10/24 2145 02/10/24 2300 02/10/24 2315  BP: 137/87 (!) 124/108 (!) 143/93 (!) 148/88  Pulse: (!) 139 (!) 133 (!) 133 (!) 127  Resp: (!) 21 (!) 29 19 18   Temp:      TempSrc:      SpO2: 93% 95% 100%  92%  Weight:      Height:        General: appears to be stated age; alert, oriented Skin: warm, dry, no rash Head:  AT/[place] Mouth:  Oral mucosa membranes appear dry, normal dentition Neck: supple; trachea midline Heart: Tachycardic, but regular; did not appreciate any M/R/G Lungs: CTAB, did not appreciate any wheezes, rales, or rhonchi Abdomen: + BS; soft, ND, right flank tenderness noted, otherwise NT Vascular: 2+ pedal pulses b/l; 2+ radial pulses b/l Extremities: no peripheral edema, no muscle wasting Neuro: strength and sensation intact in upper and lower extremities b/l    Labs on Admission: I have personally reviewed following labs and imaging studies  CBC: Recent Labs  Lab 02/10/24 2037  WBC 12.4*  NEUTROABS 10.5*  HGB 12.4  HCT 36.0  MCV 94.7  PLT 171   Basic Metabolic Panel: Recent Labs  Lab 02/10/24 2037  NA 138  K 3.3*  CL 102  CO2 26  GLUCOSE 69*  BUN 9  CREATININE 0.67  CALCIUM  9.2   GFR: Estimated Creatinine Clearance: 95.1 mL/min (by C-G formula based on SCr of 0.67 mg/dL). Liver Function Tests: Recent Labs  Lab 02/10/24 2037  AST 16  ALT 10  ALKPHOS 81  BILITOT 0.2  PROT 7.8  ALBUMIN 4.2   No results for input(s): "LIPASE", "AMYLASE" in the last 168 hours. No results for input(s): "AMMONIA" in the last 168 hours. Coagulation Profile: Recent Labs  Lab 02/10/24 2037  INR 1.0   Cardiac Enzymes: No results for input(s): "CKTOTAL", "CKMB", "CKMBINDEX", "TROPONINI" in the last 168 hours. BNP (last 3 results) No results for input(s): "PROBNP" in the last 8760 hours. HbA1C: No results for input(s): "HGBA1C" in the last 72 hours. CBG: No results for input(s): "GLUCAP" in the last 168 hours. Lipid Profile: No results for input(s): "CHOL", "HDL", "LDLCALC", "TRIG", "CHOLHDL", "LDLDIRECT" in the last 72 hours. Thyroid Function Tests: No results for input(s): "TSH", "T4TOTAL", "FREET4", "T3FREE", "THYROIDAB" in the last 72 hours. Anemia Panel: No results for input(s): "VITAMINB12", "FOLATE", "FERRITIN", "TIBC", "IRON", "RETICCTPCT" in the last 72 hours. Urine analysis:    Component Value Date/Time   COLORURINE YELLOW 02/10/2024 2201   APPEARANCEUR HAZY (A) 02/10/2024 2201   LABSPEC 1.008 02/10/2024 2201   PHURINE 6.0 02/10/2024 2201   GLUCOSEU NEGATIVE 02/10/2024 2201   HGBUR MODERATE (A) 02/10/2024 2201   BILIRUBINUR NEGATIVE 02/10/2024 2201   KETONESUR NEGATIVE 02/10/2024 2201   PROTEINUR 100 (A) 02/10/2024 2201   UROBILINOGEN 0.2 05/19/2011 0109   NITRITE POSITIVE (A) 02/10/2024 2201   LEUKOCYTESUR LARGE (A) 02/10/2024 2201    Radiological Exams on Admission: CT Renal Stone Study Result Date: 02/10/2024 CLINICAL DATA:  Abdominal and flank pain EXAM: CT ABDOMEN AND PELVIS WITHOUT CONTRAST TECHNIQUE: Multidetector CT imaging of the abdomen and pelvis was performed following the standard protocol without IV contrast. RADIATION DOSE REDUCTION:  This exam was performed according to the departmental dose-optimization program which includes automated exposure control, adjustment of the mA and/or kV according to patient size and/or use of iterative reconstruction technique. COMPARISON:  CT abdomen and pelvis 11/04/2010 FINDINGS: Lower chest: There is atelectasis in the lingula. Hepatobiliary: No focal liver abnormality is seen. No gallstones, gallbladder wall thickening, or biliary dilatation. Pancreas: Unremarkable. No pancreatic ductal dilatation or surrounding inflammatory changes. Spleen: Normal in size without focal abnormality. Adrenals/Urinary Tract: There is prominence of the mid and proximal right ureter with mild surrounding inflammatory stranding. The distal right ureter is difficult to visualize.  No definitive urinary tract calculi are seen. The adrenal glands, left kidney and bladder are within normal limits. Stomach/Bowel: Stomach is within normal limits. Appendix appears normal. No evidence of bowel wall thickening, distention, or inflammatory changes. Vascular/Lymphatic: No significant vascular findings are present. No enlarged abdominal or pelvic lymph nodes. Reproductive: There is an IUD in the uterus. Adnexa are within normal limits. Other: No abdominal wall hernia or abnormality. No abdominopelvic ascites. Musculoskeletal: No acute or significant osseous findings. IMPRESSION: 1. Prominence of the mid and proximal right ureter with mild surrounding inflammatory stranding. No definitive urinary tract calculi are seen. Findings may be related to recently passed stone or infection. 2. IUD in the uterus. Electronically Signed   By: Tyron Gallon M.D.   On: 02/10/2024 22:43   DG Chest 2 View Result Date: 02/10/2024 CLINICAL DATA:  Suspected sepsis, flank pain EXAM: CHEST - 2 VIEW COMPARISON:  05/19/2011 FINDINGS: The heart size and mediastinal contours are within normal limits. Both lungs are clear. The visualized skeletal structures are  unremarkable. IMPRESSION: No active cardiopulmonary disease. Electronically Signed   By: Janeece Mechanic M.D.   On: 02/10/2024 20:31      Assessment/Plan    Principal Problem:   Pyelonephritis of right kidney Active Problems:   Severe sepsis (HCC)   Essential hypertension   Acute right flank pain   Hypokalemia   Hypoglycemia   Depression   Acquired hypothyroidism    #) Severe sepsis due to right-sided pyelonephritis: Diagnosis on the basis of 2 to 3 days of progressive right-sided flank discomfort associated with new onset dark appearance of the urine with dysuria as well as objective fever, with presenting urinalysis appearing consistent with UTI in the setting of a hazy appearing specimen associated with significant pyuria, large leukocyte esterase, nitrite positive findings, with no evidence discussed epithelial cells to suggest a contaminated specimen.  There is also 21-50 RBCs associated with this urinalysis, while CT renal stone study was demonstrated prominence of the mid and proximal right ureter with associated inflammatory stranding, without evidence of ureteral stone, and no evidence of additional acute intra-abdominal or acute intrapelvic process.   SIRS criteria met via objective fever, leukocytosis with neutrophilic predominance, tachycardia, tachypnea. Lactic acid level: Elevated at 2.8, with repeat lactate now ordered. Of note, given the associated presence of suspected end organ damage in the form of concominant presenting lactic acidosis, criteria are met for pt's sepsis to be considered severe in nature. However, in the absence of lactic acid level that is greater than or equal to 4.0, and in the absence of any associated hypotension refractory to IVF's, there are no indications for administration of a 30 mL/kg IVF bolus at this time.   Additional ED work-up/management notable for: Blood cultures x 2 as well as urine culture were collected prior to initiation of Rocephin,  which will be continued.   No e/o additional infectious process at this time, including chest x-ray that showed no evidence of acute cardiopulmonary process, including no evidence of infiltrate to suggest pneumonia.  Additionally, COVID, influenza, RSV PCR were all negative.   Of note, the patient has received a 2 L LR bolus in the ED followed by continuous LR running at 150 cc/h.  I considered adding prn IV Toradol , although the patient conveys a history of anaphylactic reaction to this medication.   Plan: CBC w/ diff and CMP in AM.  Follow for results of blood cx's x 2 and urine cx. Abx: Continue Rocephin, as above.  Continue existing lactated Ringer's at 150 cc/h, with ordered duration noted to be 20 hours.  Repeat lactic acid level at 02/11/2024.  Prn acetaminophen for fever.  Monitor on telemetry.  As needed IV Dilaudid .  Prn IV phenergan .  Prn IV Ativan for nausea/vomiting refractory to phenergan .  Incentive spirometry.  Check TSH, urinary drug screen.  Add on procalcitonin level. Prn kpad to the right flank.                     #) Hypokalemia: presenting potassium level noted to be 3.3, with likely contributions from recent increase in GI losses in the form of 2 to 3 days of intermittent nausea/vomiting concomitant with decline in oral intake over that time.    Plan: monitor on tele. KCl  20 meq  iv over 2 hours. Add-on serum mag level. CMP, mag level in the AM.                    #) Hypoglycemia: Presenting CMP reflects very mild hypoglycemia with presenting glucose noted to be 69.  From a glycemic standpoint, she appears asymptomatic.  Suspect contribution from recent decline in oral intake with concomitant nausea/vomiting in the setting of her presenting right-sided pyelonephritis.  No known history of underlying diabetes, does not appear to be on any insulin or oral hypoglycemic agents at home.  Is also at risk for development of hypoglycemia in the  setting of presenting acute infection.  Will repeat CBG at this time and if this value remains low, we will then consider serial CBG monitoring.  Plan: Repeat CBG now, as above.  Prn amp of D50 every hour as needed for CBG result less than 70.  Add on hemoglobin A1c level.                 #) Generalized weakness: 1 to 2 day duration of generalized weakness, in the absence of any evidence of acute focal neurologic deficits, including no evidence of acute focal weakness to suggest acute CVA.  Suspect contribution from physiologic stress stemming from presenting severe sepsis due to right pyelonephritis.    Will further eval for any additional contributions from endocrine/metabolic sources, as detailed below.   Plan: work-up and management of presenting severe sepsis due to right-sided pyelonephritis, as described above. PT/OT consults ordered for the AM. Fall precautions. CMP/CBC in the AM. Check TSH, serum Mg level.                     #) Depression: documented h/o such. On Cymbalta as outpatient.    Plan: Continue outpatient Cymbalta.                     #) acquired hypothyroidism: documented h/o such, on Synthroid  as outpatient.   Plan: cont home Synthroid .  Check TSH.                 #) Essential Hypertension: documented h/o such, with outpatient antihypertensive regimen including metoprolol succinate.  In the setting of the patient's recurrent nausea/vomiting over the last 2 days, she has been unable to tolerate her home oral medications, with the absence of prn beta-blocker likely further contributing to her presenting sinus tachycardia.  SBP's in the ED today: 130s to 150s mmHg.   Plan: Close monitoring of subsequent BP via routine VS. monitor on telemetry.  Resume home beta-blocker.            DVT prophylaxis:  SCD's  Code Status: Full code Family Communication: none Disposition Plan: Per Rounding  Team Consults called: none;  Admission status: inpatient     I SPENT GREATER THAN 75  MINUTES IN CLINICAL CARE TIME/MEDICAL DECISION-MAKING IN COMPLETING THIS ADMISSION.     Shiya Fogelman B Frieda Arnall DO [facility] From 7PM - 7AM   02/10/2024, 11:25 PM

## 2024-02-11 DIAGNOSIS — R109 Unspecified abdominal pain: Secondary | ICD-10-CM | POA: Diagnosis present

## 2024-02-11 DIAGNOSIS — A419 Sepsis, unspecified organism: Secondary | ICD-10-CM | POA: Diagnosis present

## 2024-02-11 DIAGNOSIS — N12 Tubulo-interstitial nephritis, not specified as acute or chronic: Secondary | ICD-10-CM | POA: Diagnosis not present

## 2024-02-11 DIAGNOSIS — E162 Hypoglycemia, unspecified: Secondary | ICD-10-CM | POA: Diagnosis present

## 2024-02-11 DIAGNOSIS — E876 Hypokalemia: Secondary | ICD-10-CM | POA: Diagnosis present

## 2024-02-11 DIAGNOSIS — E039 Hypothyroidism, unspecified: Secondary | ICD-10-CM | POA: Diagnosis present

## 2024-02-11 DIAGNOSIS — F32A Depression, unspecified: Secondary | ICD-10-CM | POA: Diagnosis present

## 2024-02-11 DIAGNOSIS — I10 Essential (primary) hypertension: Secondary | ICD-10-CM | POA: Diagnosis present

## 2024-02-11 LAB — BLOOD CULTURE ID PANEL (REFLEXED) - BCID2

## 2024-02-11 LAB — CBC WITH DIFFERENTIAL/PLATELET
Abs Immature Granulocytes: 0.13 10*3/uL — ABNORMAL HIGH (ref 0.00–0.07)
Basophils Absolute: 0 10*3/uL (ref 0.0–0.1)
Basophils Relative: 0 %
Eosinophils Absolute: 0 10*3/uL (ref 0.0–0.5)
Eosinophils Relative: 0 %
HCT: 34.3 % — ABNORMAL LOW (ref 36.0–46.0)
Hemoglobin: 11.7 g/dL — ABNORMAL LOW (ref 12.0–15.0)
Immature Granulocytes: 1 %
Lymphocytes Relative: 9 %
Lymphs Abs: 1.3 10*3/uL (ref 0.7–4.0)
MCH: 32.1 pg (ref 26.0–34.0)
MCHC: 34.1 g/dL (ref 30.0–36.0)
MCV: 94.2 fL (ref 80.0–100.0)
Monocytes Absolute: 1 10*3/uL (ref 0.1–1.0)
Monocytes Relative: 7 %
Neutro Abs: 11.7 10*3/uL — ABNORMAL HIGH (ref 1.7–7.7)
Neutrophils Relative %: 83 %
Platelets: 127 10*3/uL — ABNORMAL LOW (ref 150–400)
RBC: 3.64 MIL/uL — ABNORMAL LOW (ref 3.87–5.11)
RDW: 14.1 % (ref 11.5–15.5)
WBC: 14.2 10*3/uL — ABNORMAL HIGH (ref 4.0–10.5)
nRBC: 0 % (ref 0.0–0.2)

## 2024-02-11 LAB — MAGNESIUM: Magnesium: 1.5 mg/dL — ABNORMAL LOW (ref 1.7–2.4)

## 2024-02-11 LAB — COMPREHENSIVE METABOLIC PANEL WITH GFR
ALT: 9 U/L (ref 0–44)
AST: 14 U/L — ABNORMAL LOW (ref 15–41)
Albumin: 3.4 g/dL — ABNORMAL LOW (ref 3.5–5.0)
Alkaline Phosphatase: 66 U/L (ref 38–126)
Anion gap: 7 (ref 5–15)
BUN: 8 mg/dL (ref 6–20)
CO2: 26 mmol/L (ref 22–32)
Calcium: 8.5 mg/dL — ABNORMAL LOW (ref 8.9–10.3)
Chloride: 105 mmol/L (ref 98–111)
Creatinine, Ser: 0.64 mg/dL (ref 0.44–1.00)
GFR, Estimated: >60 mL/min (ref >60)
Glucose, Bld: 105 mg/dL — ABNORMAL HIGH (ref 70–99)
Potassium: 3.4 mmol/L — ABNORMAL LOW (ref 3.5–5.1)
Sodium: 138 mmol/L (ref 135–145)
Total Bilirubin: 0.7 mg/dL (ref 0.0–1.2)
Total Protein: 6.5 g/dL (ref 6.5–8.1)

## 2024-02-11 LAB — RAPID URINE DRUG SCREEN, HOSP PERFORMED
Amphetamines: NOT DETECTED
Barbiturates: NOT DETECTED
Benzodiazepines: NOT DETECTED
Cocaine: NOT DETECTED
Opiates: POSITIVE — AB
Tetrahydrocannabinol: NOT DETECTED

## 2024-02-11 LAB — LACTIC ACID, PLASMA: Lactic Acid, Venous: 0.9 mmol/L (ref 0.5–1.9)

## 2024-02-11 LAB — HEMOGLOBIN A1C
Hgb A1c MFr Bld: 5.3 % (ref 4.8–5.6)
Mean Plasma Glucose: 105.41 mg/dL

## 2024-02-11 LAB — TSH: TSH: 0.481 u[IU]/mL (ref 0.350–4.500)

## 2024-02-11 LAB — PROCALCITONIN: Procalcitonin: 0.16 ng/mL

## 2024-02-11 MED ORDER — SODIUM CHLORIDE 0.9 % IV SOLN
12.5000 mg | Freq: Four times a day (QID) | INTRAVENOUS | Status: DC | PRN
  Administered 2024-02-11 – 2024-02-14 (×9): 12.5 mg via INTRAVENOUS
  Filled 2024-02-11 (×7): qty 12.5
  Filled 2024-02-11 (×2): qty 0.5

## 2024-02-11 MED ORDER — LACTATED RINGERS IV BOLUS
500.0000 mL | Freq: Once | INTRAVENOUS | Status: AC
  Administered 2024-02-11: 500 mL via INTRAVENOUS

## 2024-02-11 MED ORDER — VENLAFAXINE HCL ER 75 MG PO CP24
150.0000 mg | ORAL_CAPSULE | Freq: Every day | ORAL | Status: DC
  Administered 2024-02-11 – 2024-02-15 (×5): 150 mg via ORAL
  Filled 2024-02-11 (×5): qty 2

## 2024-02-11 MED ORDER — SODIUM CHLORIDE 0.9 % IV SOLN
25.0000 mg | INTRAVENOUS | Status: DC | PRN
  Administered 2024-02-11 – 2024-02-15 (×7): 25 mg via INTRAVENOUS
  Filled 2024-02-11 (×3): qty 25
  Filled 2024-02-11: qty 0.5
  Filled 2024-02-11 (×4): qty 25

## 2024-02-11 MED ORDER — SODIUM CHLORIDE 0.9 % IV SOLN
2.0000 g | INTRAVENOUS | Status: DC
  Administered 2024-02-11 – 2024-02-14 (×4): 2 g via INTRAVENOUS
  Filled 2024-02-11 (×4): qty 20

## 2024-02-11 MED ORDER — CLONAZEPAM 0.5 MG PO TABS
0.5000 mg | ORAL_TABLET | Freq: Every day | ORAL | Status: DC
  Administered 2024-02-11 – 2024-02-15 (×5): 0.5 mg via ORAL
  Filled 2024-02-11 (×5): qty 1

## 2024-02-11 MED ORDER — OXYCODONE HCL 5 MG PO TABS
15.0000 mg | ORAL_TABLET | Freq: Four times a day (QID) | ORAL | Status: DC | PRN
  Administered 2024-02-11 – 2024-02-15 (×8): 15 mg via ORAL
  Filled 2024-02-11 (×10): qty 3

## 2024-02-11 MED ORDER — DIPHENHYDRAMINE HCL 50 MG/ML IJ SOLN
12.5000 mg | Freq: Four times a day (QID) | INTRAMUSCULAR | Status: AC | PRN
  Administered 2024-02-11: 12.5 mg via INTRAVENOUS
  Filled 2024-02-11: qty 1

## 2024-02-11 MED ORDER — ONDANSETRON HCL 4 MG/2ML IJ SOLN
4.0000 mg | Freq: Once | INTRAMUSCULAR | Status: AC
  Administered 2024-02-11: 4 mg via INTRAVENOUS
  Filled 2024-02-11 (×2): qty 2

## 2024-02-11 MED ORDER — HYDROMORPHONE HCL 1 MG/ML IJ SOLN
1.0000 mg | Freq: Once | INTRAMUSCULAR | Status: AC
  Administered 2024-02-11: 1 mg via INTRAVENOUS

## 2024-02-11 MED ORDER — LORAZEPAM 2 MG/ML IJ SOLN
1.0000 mg | Freq: Four times a day (QID) | INTRAMUSCULAR | Status: DC | PRN
  Administered 2024-02-11 – 2024-02-15 (×6): 1 mg via INTRAVENOUS
  Filled 2024-02-11 (×6): qty 1

## 2024-02-11 MED ORDER — ACETAMINOPHEN 500 MG PO TABS: 1000.0000 mg | ORAL_TABLET | Freq: Four times a day (QID) | ORAL | Status: DC | PRN

## 2024-02-11 MED ORDER — ARIPIPRAZOLE 2 MG PO TABS
2.0000 mg | ORAL_TABLET | Freq: Every day | ORAL | Status: DC
Start: 2024-02-11 — End: 2024-02-15
  Administered 2024-02-11 – 2024-02-15 (×5): 2 mg via ORAL
  Filled 2024-02-11 (×5): qty 1

## 2024-02-11 MED ORDER — HYDROMORPHONE HCL 1 MG/ML IJ SOLN
2.0000 mg | INTRAMUSCULAR | Status: DC | PRN
  Administered 2024-02-11 – 2024-02-15 (×24): 2 mg via INTRAVENOUS
  Filled 2024-02-11 (×24): qty 2

## 2024-02-11 MED ORDER — ACETAMINOPHEN 650 MG RE SUPP: 650.0000 mg | Freq: Four times a day (QID) | RECTAL | Status: DC | PRN

## 2024-02-11 NOTE — Progress Notes (Addendum)
 0600 Pt C/O 10/10 back pain requested Dilaudid  1mg , Phenergan and Benadryl . Administered Dilaudid  1 mg IV, Pt speech is slurred and slow, pt lethargic. Educated pt that I have to wait until pharmacy mixes phenergan and I will bring the Benadryl . Pt said "When the phenergan comes up go ahead and bring the Dilaudid  again". Educated pt that she has to request that since it is a PRN medication and I just gave her 1 mg of Dilaudid . Will continue to monitor   0612 Administered Benadryl  12.5mg  IV per pt request for itching.   0627 Pt lethargic, asleep when walked in. Tech took VS 86/62 MAP 70. Pt requesting Phenergan. Educated pt again waiting on pharmacy to mix. Notified Hospitalist of hypotension

## 2024-02-11 NOTE — Progress Notes (Signed)
 PHARMACY - PHYSICIAN COMMUNICATION CRITICAL VALUE ALERT - BLOOD CULTURE IDENTIFICATION (BCID)  Sylvia Vega is an 38 y.o. female who presented to [facility] on [facility]/10/[facility]24 with Vega chief complaint of R flank pain (pyelonephritis)  Assessment:  1/2 BCx (2/4 bottles) growing E coli (expect urinary source); no resistance genes detected  Name of physician (or Provider) Contacted: Carlton Chick  Current antibiotics: Rocephin 1g q24 hr  Changes to prescribed antibiotics recommended: Increase Rocephin to 2g daily Recommendations accepted by provider  Results for orders placed or performed during the hospital encounter of 02/10/24  Blood Culture ID Panel (Reflexed) (Collected: 02/10/2024  8:37 PM)  Result Value Ref Range   Enterococcus faecalis NOT DETECTED NOT DETECTED   Enterococcus Faecium NOT DETECTED NOT DETECTED   Listeria monocytogenes NOT DETECTED NOT DETECTED   Staphylococcus species NOT DETECTED NOT DETECTED   Staphylococcus aureus (BCID) NOT DETECTED NOT DETECTED   Staphylococcus epidermidis NOT DETECTED NOT DETECTED   Staphylococcus lugdunensis NOT DETECTED NOT DETECTED   Streptococcus species NOT DETECTED NOT DETECTED   Streptococcus agalactiae NOT DETECTED NOT DETECTED   Streptococcus pneumoniae NOT DETECTED NOT DETECTED   Streptococcus pyogenes NOT DETECTED NOT DETECTED   Vega.calcoaceticus-baumannii NOT DETECTED NOT DETECTED   Bacteroides fragilis NOT DETECTED NOT DETECTED   Enterobacterales DETECTED (Vega) NOT DETECTED   Enterobacter cloacae complex NOT DETECTED NOT DETECTED   Escherichia coli DETECTED (Vega) NOT DETECTED   Klebsiella aerogenes NOT DETECTED NOT DETECTED   Klebsiella oxytoca NOT DETECTED NOT DETECTED   Klebsiella pneumoniae NOT DETECTED NOT DETECTED   Proteus species NOT DETECTED NOT DETECTED   Salmonella species NOT DETECTED NOT DETECTED   Serratia marcescens NOT DETECTED NOT DETECTED   Haemophilus influenzae NOT DETECTED NOT DETECTED   Neisseria meningitidis NOT  DETECTED NOT DETECTED   Pseudomonas aeruginosa NOT DETECTED NOT DETECTED   Stenotrophomonas maltophilia NOT DETECTED NOT DETECTED   Candida albicans NOT DETECTED NOT DETECTED   Candida auris NOT DETECTED NOT DETECTED   Candida glabrata NOT DETECTED NOT DETECTED   Candida krusei NOT DETECTED NOT DETECTED   Candida parapsilosis NOT DETECTED NOT DETECTED   Candida tropicalis NOT DETECTED NOT DETECTED   Cryptococcus neoformans/gattii NOT DETECTED NOT DETECTED   CTX-M ESBL NOT DETECTED NOT DETECTED   Carbapenem resistance IMP NOT DETECTED NOT DETECTED   Carbapenem resistance KPC NOT DETECTED NOT DETECTED   Carbapenem resistance NDM NOT DETECTED NOT DETECTED   Carbapenem resist OXA 48 LIKE NOT DETECTED NOT DETECTED   Carbapenem resistance VIM NOT DETECTED NOT DETECTED    Sylvia Vega 02/11/2024  12:10 PM

## 2024-02-11 NOTE — Progress Notes (Signed)
 Pt requested to go outside to smoke, charge nurse explained that she was not able to leave the unit unassisted and that [facility] is a tobacco free campus. Pt voiced understanding and was agreeable to the information provided.   Patient requested that I update her aunt on her status and dialed number. Aunt updated with status and all questions answered.

## 2024-02-11 NOTE — Progress Notes (Signed)
 SICKLE CELL SERVICE PROGRESS NOTE  Sylvia Vega UJW:[id number] DOB: Apr 01, 1986 DOA: 02/10/2024 PCP: Pcp, No  Assessment/Plan: Principal Problem:   Pyelonephritis of right kidney Active Problems:   Severe sepsis (HCC)   Essential hypertension   Acute right flank pain   Hypokalemia   Hypoglycemia   Depression   Acquired hypothyroidism  Acute pyelonephritis of the right kidney: Currently on IV antibiotics.  Cultures pending.  Continue antibiotics and supportive Reported sickle cell disease: No labs on file.  There is conflicting report as to whether patient has sickle cell anemia beta thalassemia to be exact or just sickle cell trait.  Will order sickle cell electrophoresis to put this to rest.  Has been chronically on Percocet.  Continue inpatient care. Essential hypertension: Continue blood pressure medications. Severe sepsis: Secondary to acute pyelonephritis.  Continue treatment as above Hypokalemia: Continue to replete potassium Acquired hypothyroidism: Continue with levothyroxine  Depression with anxiety: Continue home regimen  Code Status: Full code Family Communication: No family at bedside Disposition Plan: Home when ready but not ready now  [facility]  Pager [phone]. If 7PM-7AM, please contact night-coverage.  02/11/2024, 9:43 AM  LOS: 1 day   Brief narrative: Sylvia Vega is a 38 y.o. female with medical history significant for sickle cell trait, acquired hypothyroidism, who is admitted to [facility] on 02/10/2024 with severe sepsis due to right-sided pyelonephritis after presenting from home to [facility] ED complaining of right flank discomfort.    The patient reports to 3 days of persistent sharp, nonradiating right flank discomfort associated with new dark coloration of her urine as well as dysuria.  She also notes associated subjective fever/chills over that timeframe, in the absence of full body rigors or generalized myalgias.  This is also been associated with  intermittent nausea resulting in at least 2-3 episodes of nonbloody, nonbilious emesis.  Not associate with any diarrhea, neck stiffness, cough, shortness of breath.  Denies any associated chest pain, diaphoresis.  In the context of her intermittent nausea over the last 2 to 3 days, patient reports significant decline in oral intake over that timeframe.   her medical history is notable for sickle cell trait,confirmed per documentation acquired via chart review.  No known history of diabetes.   The patient conveys that historically, phenergan is a more effective antiemetic agent for her relative to Zofran .  Consultants: None  Procedures: CT abdomen and pelvis  Antibiotics: On IV ceftriaxone  HPI/Subjective: Patient is still complaining pain mainly flank pain.  Cultures are pending.  Review of patient's chart from outside hospitals are conflicting.  Patient talked about sickle cell trait and needs basis sickle cell beta thalassemia.  Objective: Vitals:   02/11/24 0317 02/11/24 0601 02/11/24 0626 02/11/24 0706  BP: 109/79  (!) 86/62 102/68  Pulse: (!) 132  (!) 114   Resp: 20  16   Temp: 99.9 F (37.7 C)  98.6 F (37 C)   TempSrc: Oral  Oral   SpO2: 95%  91%   Weight:  70.7 kg    Height:       Weight change:   Intake/Output Summary (Last 24 hours) at 02/11/2024 0943 Last data filed at 02/11/2024 8657 Gross per 24 hour  Intake 2178.21 ml  Output --  Net 2178.21 ml    General: Alert, awake, oriented x3, in no acute distress.  HEENT: [place]/AT PEERL, EOMI Neck: Trachea midline,  no masses, no thyromegal,y no JVD, no carotid bruit OROPHARYNX:  Moist, No exudate/ erythema/lesions.  Heart: Regular rate  and rhythm, without murmurs, rubs, gallops, PMI non-displaced, no heaves or thrills on palpation.  Lungs: Clear to auscultation, no wheezing or rhonchi noted. No increased vocal fremitus resonant to percussion  Abdomen: Soft, nontender, nondistended, positive bowel sounds, no masses no  hepatosplenomegaly noted..  Neuro: No focal neurological deficits noted cranial nerves II through XII grossly intact. DTRs 2+ bilaterally upper and lower extremities. Strength 5 out of 5 in bilateral upper and lower extremities. Musculoskeletal: No warm swelling or erythema around joints, no spinal tenderness noted. Psychiatric: Patient alert and oriented x3, good insight and cognition, good recent to remote recall. Lymph node survey: No cervical axillary or inguinal lymphadenopathy noted.   Data Reviewed: Basic Metabolic Panel: Recent Labs  Lab 02/10/24 2037 02/11/24 0242  NA 138 138  K 3.3* 3.4*  CL 102 105  CO2 26 26  GLUCOSE 69* 105*  BUN 9 8  CREATININE 0.67 0.64  CALCIUM 9.2 8.5*  MG 2.0 1.5*   Liver Function Tests: Recent Labs  Lab 02/10/24 2037 02/11/24 0242  AST 16 14*  ALT 10 9  ALKPHOS 81 66  BILITOT 0.2 0.7  PROT 7.8 6.5  ALBUMIN 4.2 3.4*   No results for input(s): "LIPASE", "AMYLASE" in the last 168 hours. No results for input(s): "AMMONIA" in the last 168 hours. CBC: Recent Labs  Lab 02/10/24 2037 02/11/24 0242  WBC 12.4* 14.2*  NEUTROABS 10.5* 11.7*  HGB 12.4 11.7*  HCT 36.0 34.3*  MCV 94.7 94.2  PLT 171 127*   Cardiac Enzymes: No results for input(s): "CKTOTAL", "CKMB", "CKMBINDEX", "TROPONINI" in the last 168 hours. BNP (last 3 results) No results for input(s): "BNP" in the last 8760 hours.  ProBNP (last 3 results) No results for input(s): "PROBNP" in the last 8760 hours.  CBG: No results for input(s): "GLUCAP" in the last 168 hours.  Recent Results (from the past 240 hours)  Culture, blood (Routine x 2)     Status: None (Preliminary result)   Collection Time: 02/10/24  8:37 PM   Specimen: BLOOD LEFT ARM  Result Value Ref Range Status   Specimen Description   Final    BLOOD LEFT ARM Performed at [facility], [address] [place], [place] [address]    Special Requests   Final    BOTTLES DRAWN AEROBIC AND ANAEROBIC Blood  Culture adequate volume Performed at [facility], [address], [place], [place] [address]    Culture   Final    NO GROWTH < 12 HOURS Performed at [facility], [address], [place], [place] [address]    Report Status PENDING  Incomplete  Resp panel by RT-PCR (RSV, Flu A&B, Covid) Anterior Nasal Swab     Status: None   Collection Time: 02/10/24  8:51 PM   Specimen: Anterior Nasal Swab  Result Value Ref Range Status   SARS Coronavirus 2 by RT PCR NEGATIVE NEGATIVE Final    Comment: (NOTE) SARS-CoV-2 target nucleic acids are NOT DETECTED.  The SARS-CoV-2 RNA is generally detectable in upper respiratory specimens during the acute phase of infection. The lowest concentration of SARS-CoV-2 viral copies this assay can detect is 138 copies/mL. A negative result does not preclude SARS-Cov-2 infection and should not be used as the sole basis for treatment or other patient management decisions. A negative result may occur with  improper specimen collection/handling, submission of specimen other than nasopharyngeal swab, presence of viral mutation(s) within the areas targeted by this assay, and inadequate  number of viral copies(<138 copies/mL). A negative result must be combined with clinical observations, patient history, and epidemiological information. The expected result is Negative.  Fact Sheet for Patients:  BloggerCourse.com  Fact Sheet for Healthcare Providers:  SeriousBroker.it  This test is no t yet approved or cleared by the [place]  FDA and  has been authorized for detection and/or diagnosis of SARS-CoV-2 by FDA under an Emergency Use Authorization (EUA). This EUA will remain  in effect (meaning this test can be used) for the duration of the COVID-19 declaration under Section 564(b)(1) of the Act, 21 U.S.C.section 360bbb-3(b)(1), unless the authorization is terminated  or revoked sooner.        Influenza A by PCR NEGATIVE NEGATIVE Final   Influenza B by PCR NEGATIVE NEGATIVE Final    Comment: (NOTE) The Xpert Xpress SARS-CoV-2/FLU/RSV plus assay is intended as an aid in the diagnosis of influenza from Nasopharyngeal swab specimens and should not be used as a sole basis for treatment. Nasal washings and aspirates are unacceptable for Xpert Xpress SARS-CoV-2/FLU/RSV testing.  Fact Sheet for Patients: BloggerCourse.com  Fact Sheet for Healthcare Providers: SeriousBroker.it  This test is not yet approved or cleared by the [place]  FDA and has been authorized for detection and/or diagnosis of SARS-CoV-2 by FDA under an Emergency Use Authorization (EUA). This EUA will remain in effect (meaning this test can be used) for the duration of the COVID-19 declaration under Section 564(b)(1) of the Act, 21 U.S.C. section 360bbb-3(b)(1), unless the authorization is terminated or revoked.     Resp Syncytial Virus by PCR NEGATIVE NEGATIVE Final    Comment: (NOTE) Fact Sheet for Patients: BloggerCourse.com  Fact Sheet for Healthcare Providers: SeriousBroker.it  This test is not yet approved or cleared by the [place]  FDA and has been authorized for detection and/or diagnosis of SARS-CoV-2 by FDA under an Emergency Use Authorization (EUA). This EUA will remain in effect (meaning this test can be used) for the duration of the COVID-19 declaration under Section 564(b)(1) of the Act, 21 U.S.C. section 360bbb-3(b)(1), unless the authorization is terminated or revoked.  Performed at [facility], [address], [place], [place] [address]   Culture, blood (Routine x 2)     Status: None (Preliminary result)   Collection Time: 02/10/24  9:29 PM   Specimen: BLOOD RIGHT FOREARM  Result Value Ref Range Status   Specimen Description   Final    BLOOD  RIGHT FOREARM Performed at [facility], [address], [place], [place] [address]    Special Requests   Final    BOTTLES DRAWN AEROBIC AND ANAEROBIC Blood Culture results may not be optimal due to an inadequate volume of blood received in culture bottles Performed at [facility], [address], [place], [place] [address]    Culture   Final    NO GROWTH < 12 HOURS Performed at [facility], [address], [place], [place] [address]    Report Status PENDING  Incomplete     Studies: CT Renal Stone Study Result Date: 02/10/2024 CLINICAL DATA:  Abdominal and flank pain EXAM: CT ABDOMEN AND PELVIS WITHOUT CONTRAST TECHNIQUE: Multidetector CT imaging of the abdomen and pelvis was performed following the standard protocol without IV contrast. RADIATION DOSE REDUCTION: This exam was performed according to the departmental dose-optimization program which includes automated exposure control, adjustment of the mA and/or kV according to patient size and/or use of iterative reconstruction technique. COMPARISON:  CT abdomen  and pelvis 11/04/2010 FINDINGS: Lower chest: There is atelectasis in the lingula. Hepatobiliary: No focal liver abnormality is seen. No gallstones, gallbladder wall thickening, or biliary dilatation. Pancreas: Unremarkable. No pancreatic ductal dilatation or surrounding inflammatory changes. Spleen: Normal in size without focal abnormality. Adrenals/Urinary Tract: There is prominence of the mid and proximal right ureter with mild surrounding inflammatory stranding. The distal right ureter is difficult to visualize. No definitive urinary tract calculi are seen. The adrenal glands, left kidney and bladder are within normal limits. Stomach/Bowel: Stomach is within normal limits. Appendix appears normal. No evidence of bowel wall thickening, distention, or inflammatory changes. Vascular/Lymphatic: No significant vascular findings are present. No enlarged abdominal  or pelvic lymph nodes. Reproductive: There is an IUD in the uterus. Adnexa are within normal limits. Other: No abdominal wall hernia or abnormality. No abdominopelvic ascites. Musculoskeletal: No acute or significant osseous findings. IMPRESSION: 1. Prominence of the mid and proximal right ureter with mild surrounding inflammatory stranding. No definitive urinary tract calculi are seen. Findings may be related to recently passed stone or infection. 2. IUD in the uterus. Electronically Signed   By: Tyron Gallon M.D.   On: 02/10/2024 22:43   DG Chest 2 View Result Date: 02/10/2024 CLINICAL DATA:  Suspected sepsis, flank pain EXAM: CHEST - 2 VIEW COMPARISON:  05/19/2011 FINDINGS: The heart size and mediastinal contours are within normal limits. Both lungs are clear. The visualized skeletal structures are unremarkable. IMPRESSION: No active cardiopulmonary disease. Electronically Signed   By: Janeece Mechanic M.D.   On: 02/10/2024 20:31    Scheduled Meds:  metoprolol succinate  25 mg Oral Daily   Continuous Infusions:  cefTRIAXone (ROCEPHIN)  IV     lactated ringers 150 mL/hr at 02/10/24 2135   promethazine (PHENERGAN) injection (IM or IVPB) 12.5 mg (02/11/24 0720)    Principal Problem:   Pyelonephritis of right kidney Active Problems:   Severe sepsis (HCC)   Essential hypertension   Acute right flank pain   Hypokalemia   Hypoglycemia   Depression   Acquired hypothyroidism

## 2024-02-11 NOTE — Evaluation (Signed)
 Physical Therapy Evaluation Patient Details Name: Sylvia Vega MRN: [id number] DOB: 1986-06-22 Today's Date: 02/11/2024  History of Present Illness  38 y.o. female who is admitted to [facility] on 02/10/2024 with severe sepsis due to right-sided pyelonephritis after presenting from home to [facility] ED complaining of right flank discomfort. PMH: sickle cell trait, acquired hypothyroidism  Clinical Impression  Pt admitted with above diagnosis.  Pt reports severe pain however is agreeable to mobilize within limits. Amb ~ 33' with RW and min-CGA, one episode of bil knee buckling with pt able to self recover. Distance limited by pain. Pt is heavily reliant on UEs/RW today  but feels she may need a cane for home, will continue assess need for DME.  Pt currently with functional limitations due to the deficits listed below (see PT Problem List). Pt will benefit from acute skilled PT to increase their independence and safety with mobility to allow discharge.           If plan is discharge home, recommend the following: Help with stairs or ramp for entrance   Can travel by private vehicle        Equipment Recommendations Other (comment) (TBD)  Recommendations for Other Services       Functional Status Assessment       Precautions / Restrictions Precautions Precautions: Fall Restrictions Weight Bearing Restrictions Per Provider Order: No      Mobility  Bed Mobility Overal bed mobility: Needs Assistance Bed Mobility: Supine to Sit     Supine to sit: Supervision          Transfers Overall transfer level: Needs assistance Equipment used: Rolling walker (2 wheels) Transfers: Sit to/from Stand Sit to Stand: Contact guard assist           General transfer comment: STS x2; from bed and recliner. cues for safety, hand placement    Ambulation/Gait Ambulation/Gait assistance: Min assist, Contact guard assist Gait Distance (Feet): 60 Feet (15' more) Assistive device:  Rolling walker (2 wheels) Gait Pattern/deviations: Step-through pattern, Decreased stride length, Trunk flexed, Narrow base of support       General Gait Details: cues for trunk extension as able. one episode of knee buckling with CGA/self recovery; heavily reliant on UEs/RW  Stairs            Wheelchair Mobility     Tilt Bed    Modified Rankin (Stroke Patients Only)       Balance Overall balance assessment: Needs assistance Sitting-balance support: Feet supported, No upper extremity supported Sitting balance-Leahy Scale: Good     Standing balance support: Reliant on assistive device for balance, During functional activity Standing balance-Leahy Scale: Poor                               Pertinent Vitals/Pain Pain Assessment Pain Assessment: Faces Faces Pain Scale: Hurts worst Pain Location: back Pain Descriptors / Indicators: Grimacing, Constant Pain Intervention(s):  (RN provided with hot packs)    Home Living Family/patient expects to be discharged to:: Private residence Living Arrangements: Children;Spouse/significant other Available Help at Discharge: Family Type of Home: House Home Access: Level entry       Home Layout: One level Home Equipment: None      Prior Function Prior Level of Function : Independent/Modified Independent                     Extremity/Trunk Assessment   Upper Extremity Assessment  Upper Extremity Assessment: Defer to OT evaluation;Overall [facility] for tasks assessed    Lower Extremity Assessment Lower Extremity Assessment: Overall WFL for tasks assessed       Communication   Communication Communication: (P) No apparent difficulties Factors Affecting Communication: Reduced clarity of speech    Cognition Arousal: Alert Behavior During Therapy: WFL for tasks assessed/performed   PT - Cognitive impairments: No apparent impairments                         Following commands: Intact        Cueing Cueing Techniques: Verbal cues     General Comments      Exercises     Assessment/Plan    PT Assessment Patient needs continued PT services  PT Problem List Decreased mobility;Decreased activity tolerance;Pain;Decreased knowledge of use of DME       PT Treatment Interventions DME instruction;Therapeutic exercise;Gait training;Functional mobility training;Therapeutic activities;Patient/family education    PT Goals (Current goals can be found in the Care Plan section)  Acute Rehab PT Goals Patient Stated Goal: less pain PT Goal Formulation: With patient Time For Goal Achievement: 02/18/24 Potential to Achieve Goals: Good    Frequency Min 2X/week     Co-evaluation               AM-PAC PT "6 Clicks" Mobility  Outcome Measure Help needed turning from your back to your side while in a flat bed without using bedrails?: A Little Help needed moving from lying on your back to sitting on the side of a flat bed without using bedrails?: A Little Help needed moving to and from a bed to a chair (including a wheelchair)?: A Little Help needed standing up from a chair using your arms (e.g., wheelchair or bedside chair)?: A Little Help needed to walk in hospital room?: A Little Help needed climbing 3-5 steps with a railing? : A Little 6 Click Score: 18    End of Session Equipment Utilized During Treatment: Gait belt Activity Tolerance: Patient tolerated treatment well;Patient limited by pain Patient left: in bed;with call bell/phone within reach;with bed alarm set   PT Visit Diagnosis: Other abnormalities of gait and mobility (R26.89)    Time: 6295-2841 PT Time Calculation (min) (ACUTE ONLY): 24 min   Charges:   PT Evaluation $PT Eval Low Complexity: 1 Low PT Treatments $Gait Training: 8-22 mins PT General Charges $$ ACUTE PT VISIT: 1 Visit         Shawonda Kerce, PT  Acute Rehab Dept [facility]) [phone]  02/11/2024   [facility] 02/11/2024, 3:42 PM

## 2024-02-11 NOTE — Plan of Care (Signed)
  Problem: Education: Goal: Knowledge of General Education information will improve Description: Including pain rating scale, medication(s)/side effects and non-pharmacologic comfort measures Outcome: Progressing   Problem: Elimination: Goal: Will not experience complications related to bowel motility Outcome: Progressing Goal: Will not experience complications related to urinary retention Outcome: Progressing   Problem: Pain Managment: Goal: General experience of comfort will improve and/or be controlled Outcome: Progressing   Problem: Safety: Goal: Ability to remain free from injury will improve Outcome: Progressing

## 2024-02-12 LAB — URINE CULTURE: Culture: <10000 — AB

## 2024-02-12 NOTE — TOC Initial Note (Signed)
 Transition of Care [facility]) - Initial/Assessment Note   Patient Details  Name: Sylvia Vega MRN: [id number] Date of Birth: Feb 07, 1986  [facility]) CM/SW Contact:    Zenon Hilda, LCSW Phone Number: 02/12/2024, 9:27 AM  Clinical Narrative: PT evaluation did not recommend any PT follow up, but indicated patient may need DME at discharge. TOC to follow for possible DME recommendations.  Expected Discharge Plan: Home/Self Care Barriers to Discharge: Continued Medical Work up  Expected Discharge Plan and Services In-house Referral: Clinical Social Work Living arrangements for the past 2 months: Single Family Home  Prior Living Arrangements/Services Living arrangements for the past 2 months: Single Family Home Lives with:: Significant Other, Minor Children Patient language and need for interpreter reviewed:: Yes Do you feel safe going back to the place where you live?: Yes      Need for Family Participation in Patient Care: No (Comment) Care giver support system in place?: Yes (comment) Criminal Activity/Legal Involvement Pertinent to Current Situation/Hospitalization: No - Comment as needed  Activities of Daily Living ADL Screening (condition at time of admission) Independently performs ADLs?: Yes (appropriate for developmental age) Is the patient deaf or have difficulty hearing?: No Does the patient have difficulty seeing, even when wearing glasses/contacts?: No Does the patient have difficulty concentrating, remembering, or making decisions?: No  Emotional Assessment Orientation: : Oriented to Self, Oriented to Place, Oriented to  Time, Oriented to Situation Alcohol / Substance Use: Not Applicable Psych Involvement: No (comment)  Admission diagnosis:  Pyelonephritis [N12] Patient Active Problem List   Diagnosis Date Noted   Severe sepsis (HCC) 02/11/2024   Acute right flank pain 02/11/2024   Hypokalemia 02/11/2024   Hypoglycemia 02/11/2024   Depression  02/11/2024   Acquired hypothyroidism 02/11/2024   Essential hypertension    Pyelonephritis of right kidney 02/10/2024   PCP:  Pcp, No Pharmacy:   [facility] [address] [place], [place] - [address] AT [facility] [address] [place] [address] Phone: [phone] Fax: [phone]  [facility] [place], [place] - [address] [place] [address] Phone: [phone] Fax: [phone]  Social Drivers of Health (SDOH) Social History: SDOH Screenings   Food Insecurity: No Food Insecurity (02/11/2024)  Housing: Unknown (02/11/2024)  Transportation Needs: No Transportation Needs (02/11/2024)  Utilities: Not At Risk (02/11/2024)  Financial Resource Strain: Low Risk  (11/24/2022)   [facility] from [facility]  Physical Activity: Insufficiently Active (07/11/2022)   Received from [facility]  Social Connections: Moderately Isolated (07/11/2022)   Received from [facility]  Stress: No Stress Concern Present (07/11/2022)   Received from [facility]  Tobacco Use: High Risk (02/10/2024)  Health Literacy: Low Risk  (01/16/2021)   Received from [facility]   SDOH Interventions:    Readmission Risk Interventions     No data to display

## 2024-02-12 NOTE — Progress Notes (Signed)
 Mobility Specialist - Progress Note  Pre-mobility: 113 bpm HR,  During mobility: 121 bpm HR, Post-mobility: 112 bpm HR,    02/12/24 1050  Mobility  Activity Ambulated with assistance in hallway  Level of Assistance Contact guard assist, steadying assist  Assistive Device Front wheel walker  Distance Ambulated (ft) 200 ft  Range of Motion/Exercises Active  Activity Response Tolerated fair  Mobility Referral Yes  Mobility visit 1 Mobility  Mobility Specialist Start Time (ACUTE ONLY) 1020  Mobility Specialist Stop Time (ACUTE ONLY) 1050  Mobility Specialist Time Calculation (min) (ACUTE ONLY) 30 min   Pt was found in bed and agreeable to ambulate. Had x1 brief standing rest break due to fatigued. At EOS returned to bed with all needs met. Call bell in reach and RN notified of session.  Lorna Rose Mobility Specialist

## 2024-02-12 NOTE — Evaluation (Signed)
 Occupational Therapy Evaluation Patient Details Name: Sylvia Vega MRN: [id number] DOB: [birth year] Today's Date: 02/12/2024   History of Present Illness   38 y.o. female who is admitted to [facility] on 02/10/2024 with severe sepsis due to right-sided pyelonephritis after presenting from home to [facility] ED complaining of right flank discomfort. PMH: sickle cell trait, acquired hypothyroidism     Clinical Impressions PTA, patient was living at home with husband and family at independent level. Patient evaluated by Occupational Therapy with no further acute OT needs identified. All education has been completed and the patient has no further questions. Issued and trained in light tband and reacher with + teach back. Family and patient in agreement with OT d/c.  See below for any follow-up Occupational Therapy or equipment needs. OT is signing off. Thank you for this referral.      If plan is discharge home, recommend the following:   Assistance with cooking/housework;Assist for transportation;Help with stairs or ramp for entrance     Functional Status Assessment   Patient has not had a recent decline in their functional status     Equipment Recommendations   None recommended by OT      Precautions/Restrictions   Precautions Precautions: Fall Restrictions Weight Bearing Restrictions Per Provider Order: No     Mobility Bed Mobility Overal bed mobility: Modified Independent                  Transfers Overall transfer level: Needs assistance Equipment used: Rolling walker (2 wheels) Transfers: Sit to/from Stand, Bed to chair/wheelchair/BSC Sit to Stand: Supervision     Step pivot transfers: Supervision     General transfer comment: patient ambulating around room and then after OT visit in hallway with husband and son      Balance Overall balance assessment: Needs assistance Sitting-balance support: No upper extremity supported, Feet  unsupported Sitting balance-Leahy Scale: Good     Standing balance support: During functional activity Standing balance-Leahy Scale: Fair                             ADL either performed or assessed with clinical judgement   ADL Overall ADL's : Modified independent                                             Vision Baseline Vision/History: 1 Wears glasses Ability to See in Adequate Light: 0 Adequate Patient Visual Report: No change from baseline Vision Assessment?: Wears glasses for reading     Perception Perception: Within Functional Limits       Praxis Praxis: [facility]       Pertinent Vitals/Pain Pain Assessment Pain Assessment: Faces Faces Pain Scale: Hurts whole lot Pain Location: back Pain Descriptors / Indicators: Grimacing, Constant     Extremity/Trunk Assessment Upper Extremity Assessment Upper Extremity Assessment: Right hand dominant;Overall [facility] for tasks assessed   Lower Extremity Assessment Lower Extremity Assessment: Overall WFL for tasks assessed   Cervical / Trunk Assessment Cervical / Trunk Assessment: Normal   Communication Communication Factors Affecting Communication: Reduced clarity of speech   Cognition Arousal: Alert Behavior During Therapy: WFL for tasks assessed/performed Cognition: No apparent impairments             OT - Cognition Comments: patient reports pain meds impact processing and LOA but otherwise relatovely intact this  visit                 Following commands: Intact       Cueing  General Comments   Cueing Techniques: Verbal cues  back brace in place for comfort patient purchased on her own pta           Home Living Family/patient expects to be discharged to:: Private residence Living Arrangements: Children;Spouse/significant other Available Help at Discharge: Family Type of Home: House Home Access: Level entry     Home Layout: One level     Bathroom Shower/Tub:  Tub/shower unit;Door   [employer] Toilet: Standard Bathroom Accessibility: Yes How Accessible: Accessible via walker Home Equipment: None          Prior Functioning/Environment Prior Level of Function : Independent/Modified Independent                     AM-PAC OT "6 Clicks" Daily Activity     Outcome Measure Help from another person eating meals?: None Help from another person taking care of personal grooming?: None Help from another person toileting, which includes using toliet, bedpan, or urinal?: None Help from another person bathing (including washing, rinsing, drying)?: None Help from another person to put on and taking off regular upper body clothing?: None Help from another person to put on and taking off regular lower body clothing?: None 6 Click Score: 24   End of Session Equipment Utilized During Treatment: Gait belt;Rolling walker (2 wheels) Nurse Communication: Mobility status  Activity Tolerance: Patient tolerated treatment well Patient left: with family/visitor present;Other (comment) (nursing allowing hallway ambulation with husband and 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

## 2024-02-12 NOTE — Progress Notes (Signed)
 Patient ID: Sylvia Vega, female   DOB: December 04, 1985, 38 y.o.   MRN: [id number] Subjective: Sylvia Vega is a 38 y.o. female with medical history significant for sickle cell trait, acquired hypothyroidism, depression & Anxiety, Essential hypertension, Acquired hypothyroidism who is [facility] with severe sepsis due to right-sided pyelonephritis after presenting from home to [facility] ED complaining of right flank discomfort. Patient denies Nausea, chest pain, cough and shortness of breath today. She rates pain 8/10. With ongoing urinary frequency.     Objective:  Vital signs in last 24 hours:  Vitals:   02/11/24 1612 02/11/24 2023 02/11/24 2151 02/12/24 0416  BP: (!) 136/95 (!) 136/98 (!) 132/99 (!) 138/103  Pulse: (!) 115 (!) 131 (!) 124 (!) 113  Resp: 20 (!) 25 (!) 24 20  Temp: 97.6 F (36.4 C) (!) 100.6 F (38.1 C) 99.8 F (37.7 C) 99.7 F (37.6 C)  TempSrc: Oral Oral Oral Oral  SpO2: 100% 97% 98% 92%  Weight:      Height:        Intake/Output from previous day:   Intake/Output Summary (Last 24 hours) at 02/12/2024 1137 Last data filed at 02/11/2024 1821 Gross per 24 hour  Intake 1411.01 ml  Output --  Net 1411.01 ml    Physical Exam: General: Alert, awake, oriented x3, in no acute distress.  HEENT: [place]/AT PEERL, EOMI Neck: Trachea midline,  no masses, no thyromegal,y no JVD, no carotid bruit OROPHARYNX:  Moist, No exudate/ erythema/lesions.  Heart: Regular rate and rhythm, without murmurs, rubs, gallops, PMI non-displaced, no heaves or thrills on palpation.  Lungs: Clear to auscultation, no wheezing or rhonchi noted. No increased vocal fremitus resonant to percussion  Abdomen: Soft, nontender, nondistended, positive bowel sounds, no masses no hepatosplenomegaly noted..  Neuro: No focal neurological deficits noted cranial nerves II through XII grossly intact. DTRs 2+ bilaterally upper and lower extremities. Strength 5 out of 5 in bilateral upper  and lower extremities. Musculoskeletal: Right flank pain  Psychiatric: Patient alert and oriented x3, good insight and cognition, good recent to remote recall. Lymph node survey: No cervical axillary or inguinal lymphadenopathy noted.  Lab Results:  Basic Metabolic Panel:    Component Value Date/Time   NA 138 02/11/2024 0242   K 3.4 (L) 02/11/2024 0242   CL 105 02/11/2024 0242   CO2 26 02/11/2024 0242   BUN 8 02/11/2024 0242   CREATININE 0.64 02/11/2024 0242   GLUCOSE 105 (H) 02/11/2024 0242   CALCIUM 8.5 (L) 02/11/2024 0242   CBC:    Component Value Date/Time   WBC 14.2 (H) 02/11/2024 0242   HGB 11.7 (L) 02/11/2024 0242   HCT 34.3 (L) 02/11/2024 0242   PLT 127 (L) 02/11/2024 0242   MCV 94.2 02/11/2024 0242   NEUTROABS 11.7 (H) 02/11/2024 0242   LYMPHSABS 1.3 02/11/2024 0242   MONOABS 1.0 02/11/2024 0242   EOSABS 0.0 02/11/2024 0242   BASOSABS 0.0 02/11/2024 0242    Recent Results (from the past 240 hours)  Culture, blood (Routine x 2)     Status: Abnormal (Preliminary result)   Collection Time: 02/10/24  8:37 PM   Specimen: BLOOD LEFT ARM  Result Value Ref Range Status   Specimen Description   Final    BLOOD LEFT ARM Performed at [facility], [address], [place], [place] [address]    Special Requests   Final    BOTTLES DRAWN AEROBIC AND ANAEROBIC Blood Culture adequate volume Performed at [facility], [address] [place], [place] [address]    Culture  Setup Time   Final    GRAM NEGATIVE RODS IN BOTH AEROBIC AND ANAEROBIC BOTTLES CRITICAL RESULT CALLED TO, READ BACK BY AND VERIFIED WITH: Glennis Lansing [facility] [id number] BY Chestine Costain, MT Performed at [facility], [address], [place], [place] [address]    Culture ESCHERICHIA COLI (A)  Final   Report Status PENDING  Incomplete  Blood Culture ID Panel (Reflexed)     Status: Abnormal   Collection Time: 02/10/24  8:37 PM  Result Value Ref Range Status   Enterococcus  faecalis NOT DETECTED NOT DETECTED Final   Enterococcus Faecium NOT DETECTED NOT DETECTED Final   Listeria monocytogenes NOT DETECTED NOT DETECTED Final   Staphylococcus species NOT DETECTED NOT DETECTED Final   Staphylococcus aureus (BCID) NOT DETECTED NOT DETECTED Final   Staphylococcus epidermidis NOT DETECTED NOT DETECTED Final   Staphylococcus lugdunensis NOT DETECTED NOT DETECTED Final   Streptococcus species NOT DETECTED NOT DETECTED Final   Streptococcus agalactiae NOT DETECTED NOT DETECTED Final   Streptococcus pneumoniae NOT DETECTED NOT DETECTED Final   Streptococcus pyogenes NOT DETECTED NOT DETECTED Final   A.calcoaceticus-baumannii NOT DETECTED NOT DETECTED Final   Bacteroides fragilis NOT DETECTED NOT DETECTED Final   Enterobacterales DETECTED (A) NOT DETECTED Final    Comment: Enterobacterales represent a large order of gram negative bacteria, not a single organism. CRITICAL RESULT CALLED TO, READ BACK BY AND VERIFIED WITH: PHARMD DREW WOFFORD [id number] BY J RAZZAK, MT    Enterobacter cloacae complex NOT DETECTED NOT DETECTED Final   Escherichia coli DETECTED (A) NOT DETECTED Final    Comment: CRITICAL RESULT CALLED TO, READ BACK BY AND VERIFIED WITH: Glennis Lansing [facility] [phone] [id number] BY J RAZZAK, MT    Klebsiella aerogenes NOT DETECTED NOT DETECTED Final   Klebsiella oxytoca NOT DETECTED NOT DETECTED Final   Klebsiella pneumoniae NOT DETECTED NOT DETECTED Final   Proteus species NOT DETECTED NOT DETECTED Final   Salmonella species NOT DETECTED NOT DETECTED Final   Serratia marcescens NOT DETECTED NOT DETECTED Final   Haemophilus influenzae NOT DETECTED NOT DETECTED Final   Neisseria meningitidis NOT DETECTED NOT DETECTED Final   Pseudomonas aeruginosa NOT DETECTED NOT DETECTED Final   Stenotrophomonas maltophilia NOT DETECTED NOT DETECTED Final   Candida albicans NOT DETECTED NOT DETECTED Final   Candida auris NOT DETECTED NOT DETECTED Final   Candida glabrata  NOT DETECTED NOT DETECTED Final   Candida krusei NOT DETECTED NOT DETECTED Final   Candida parapsilosis NOT DETECTED NOT DETECTED Final   Candida tropicalis NOT DETECTED NOT DETECTED Final   Cryptococcus neoformans/gattii NOT DETECTED NOT DETECTED Final   CTX-M ESBL NOT DETECTED NOT DETECTED Final   Carbapenem resistance IMP NOT DETECTED NOT DETECTED Final   Carbapenem resistance KPC NOT DETECTED NOT DETECTED Final   Carbapenem resistance NDM NOT DETECTED NOT DETECTED Final   Carbapenem resist OXA 48 LIKE NOT DETECTED NOT DETECTED Final   Carbapenem resistance VIM NOT DETECTED NOT DETECTED Final    Comment: Performed at [facility], [address] [place], [place] [address]  Resp panel by RT-PCR (RSV, Flu A&B, Covid) Anterior Nasal Swab     Status: None   Collection Time: 02/10/24  8:51 PM   Specimen: Anterior Nasal Swab  Result Value Ref Range Status   SARS Coronavirus 2 by RT PCR NEGATIVE NEGATIVE Final    Comment: (NOTE) SARS-CoV-2 target  nucleic acids are NOT DETECTED.  The SARS-CoV-2 RNA is generally detectable in upper respiratory specimens during the acute phase of infection. The lowest concentration of SARS-CoV-2 viral copies this assay can detect is 138 copies/mL. A negative result does not preclude SARS-Cov-2 infection and should not be used as the sole basis for treatment or other patient management decisions. A negative result may occur with  improper specimen collection/handling, submission of specimen other than nasopharyngeal swab, presence of viral mutation(s) within the areas targeted by this assay, and inadequate number of viral copies(<138 copies/mL). A negative result must be combined with clinical observations, patient history, and epidemiological information. The expected result is Negative.  Fact Sheet for Patients:  BloggerCourse.com  Fact Sheet for Healthcare Providers:  SeriousBroker.it  This  test is no t yet approved or cleared by the [place]  FDA and  has been authorized for detection and/or diagnosis of SARS-CoV-2 by FDA under an Emergency Use Authorization (EUA). This EUA will remain  in effect (meaning this test can be used) for the duration of the COVID-19 declaration under Section 564(b)(1) of the Act, 21 U.S.C.section 360bbb-3(b)(1), unless the authorization is terminated  or revoked sooner.       Influenza A by PCR NEGATIVE NEGATIVE Final   Influenza B by PCR NEGATIVE NEGATIVE Final    Comment: (NOTE) The Xpert Xpress SARS-CoV-2/FLU/RSV plus assay is intended as an aid in the diagnosis of influenza from Nasopharyngeal swab specimens and should not be used as a sole basis for treatment. Nasal washings and aspirates are unacceptable for Xpert Xpress SARS-CoV-2/FLU/RSV testing.  Fact Sheet for Patients: BloggerCourse.com  Fact Sheet for Healthcare Providers: SeriousBroker.it  This test is not yet approved or cleared by the [place]  FDA and has been authorized for detection and/or diagnosis of SARS-CoV-2 by FDA under an Emergency Use Authorization (EUA). This EUA will remain in effect (meaning this test can be used) for the duration of the COVID-19 declaration under Section 564(b)(1) of the Act, 21 U.S.C. section 360bbb-3(b)(1), unless the authorization is terminated or revoked.     Resp Syncytial Virus by PCR NEGATIVE NEGATIVE Final    Comment: (NOTE) Fact Sheet for Patients: BloggerCourse.com  Fact Sheet for Healthcare Providers: SeriousBroker.it  This test is not yet approved or cleared by the [place]  FDA and has been authorized for detection and/or diagnosis of SARS-CoV-2 by FDA under an Emergency Use Authorization (EUA). This EUA will remain in effect (meaning this test can be used) for the duration of the COVID-19 declaration under  Section 564(b)(1) of the Act, 21 U.S.C. section 360bbb-3(b)(1), unless the authorization is terminated or revoked.  Performed at [facility], [address], [place], [place] [address]   Culture, blood (Routine x 2)     Status: None (Preliminary result)   Collection Time: 02/10/24  9:29 PM   Specimen: BLOOD RIGHT FOREARM  Result Value Ref Range Status   Specimen Description   Final    BLOOD RIGHT FOREARM Performed at [facility], [address], [place], [place] [address]    Special Requests   Final    BOTTLES DRAWN AEROBIC AND ANAEROBIC Blood Culture results may not be optimal due to an inadequate volume of blood received in culture bottles Performed at [facility], [address] [place], [place] [address]    Culture   Final    NO GROWTH 2 DAYS Performed at [facility], [address], [place], [place] [address]  Report Status PENDING  Incomplete  Urine Culture     Status: Abnormal   Collection Time: 02/10/24 10:01 PM   Specimen: Urine, Random  Result Value Ref Range Status   Specimen Description   Final    URINE, RANDOM Performed at [facility], [address], [place], [place] [address]    Special Requests   Final    NONE Reflexed from [phone] Performed at [facility], [address] [place], [place] [address]    Culture (A)  Final    <10,000 COLONIES/mL INSIGNIFICANT GROWTH Performed at [facility], [address], [place], [place] [address]    Report Status 02/12/2024 FINAL  Final    Studies/Results: CT Renal Stone Study Result Date: 02/10/2024 CLINICAL DATA:  Abdominal and flank pain EXAM: CT ABDOMEN AND PELVIS WITHOUT CONTRAST TECHNIQUE: Multidetector CT imaging of the abdomen and pelvis was performed following the standard protocol without IV contrast. RADIATION DOSE REDUCTION: This exam was performed according to the departmental dose-optimization program which  includes automated exposure control, adjustment of the mA and/or kV according to patient size and/or use of iterative reconstruction technique. COMPARISON:  CT abdomen and pelvis 11/04/2010 FINDINGS: Lower chest: There is atelectasis in the lingula. Hepatobiliary: No focal liver abnormality is seen. No gallstones, gallbladder wall thickening, or biliary dilatation. Pancreas: Unremarkable. No pancreatic ductal dilatation or surrounding inflammatory changes. Spleen: Normal in size without focal abnormality. Adrenals/Urinary Tract: There is prominence of the mid and proximal right ureter with mild surrounding inflammatory stranding. The distal right ureter is difficult to visualize. No definitive urinary tract calculi are seen. The adrenal glands, left kidney and bladder are within normal limits. Stomach/Bowel: Stomach is within normal limits. Appendix appears normal. No evidence of bowel wall thickening, distention, or inflammatory changes. Vascular/Lymphatic: No significant vascular findings are present. No enlarged abdominal or pelvic lymph nodes. Reproductive: There is an IUD in the uterus. Adnexa are within normal limits. Other: No abdominal wall hernia or abnormality. No abdominopelvic ascites. Musculoskeletal: No acute or significant osseous findings. IMPRESSION: 1. Prominence of the mid and proximal right ureter with mild surrounding inflammatory stranding. No definitive urinary tract calculi are seen. Findings may be related to recently passed stone or infection. 2. IUD in the uterus. Electronically Signed   By: Tyron Gallon M.D.   On: 02/10/2024 22:43   DG Chest 2 View Result Date: 02/10/2024 CLINICAL DATA:  Suspected sepsis, flank pain EXAM: CHEST - 2 VIEW COMPARISON:  05/19/2011 FINDINGS: The heart size and mediastinal contours are within normal limits. Both lungs are clear. The visualized skeletal structures are unremarkable. IMPRESSION: No active cardiopulmonary disease. Electronically Signed   By:  Janeece Mechanic M.D.   On: 02/10/2024 20:31    Medications: Scheduled Meds:  ARIPiprazole  2 mg Oral Daily   clonazePAM  0.5 mg Oral Daily   metoprolol succinate  25 mg Oral Daily   venlafaxine XR  150 mg Oral Daily   Continuous Infusions:  cefTRIAXone (ROCEPHIN)  IV Stopped (02/11/24 1711)   diphenhydrAMINE  25 mg (02/12/24 1055)   promethazine (PHENERGAN) injection (IM or IVPB) 12.5 mg (02/12/24 0420)   PRN Meds:.acetaminophen **OR** acetaminophen, dextrose , diphenhydrAMINE , HYDROmorphone  (DILAUDID ) injection, LORazepam, melatonin, naLOXone (NARCAN)  injection, oxyCODONE, promethazine (PHENERGAN) injection (IM or IVPB)  Consultants: None  Procedures: None  Antibiotics: Rocephin  Assessment/Plan: Principal Problem:   Pyelonephritis of right kidney Active Problems:   Severe sepsis (HCC)   Essential hypertension   Acute right flank pain  Hypokalemia   Hypoglycemia   Depression   Acquired hypothyroidism   Acute pyelonephritis of the right kidney: Currently on IV antibiotics.  Cultures pending.  Continue antibiotics and supportive treatment Reported sickle cell disease: There is conflicting report as to whether patient has sickle cell anemia beta thalassemia to be exact or just sickle cell trait. Sickle cell electrophoresis labs ordered results pending.   Essential hypertension: Continue blood pressure medications. Severe sepsis: Secondary to acute pyelonephritis.  Continue treatment as above Hypokalemia: Continue to replace potassium Acquired hypothyroidism: Continue with levothyroxine  Depression with anxiety: Continue home regimen  Code Status: Full Code Family Communication: N/A Disposition Plan: Not yet ready for discharge  Lorel Roes NP   If 7PM-7AM, please contact night-coverage.  02/12/2024, 11:37 AM  LOS: 2 days

## 2024-02-13 LAB — CULTURE, BLOOD (ROUTINE X 2): Special Requests: ADEQUATE

## 2024-02-13 NOTE — Progress Notes (Signed)
 Physical Therapy Treatment Patient Details Name: Sylvia Vega MRN: [id number] DOB: 02-15-1986 Today's Date: 02/13/2024   History of Present Illness 38 y.o. female who is admitted to [facility] on 02/10/2024 with severe sepsis due to right-sided pyelonephritis after presenting from home to [facility] ED complaining of right flank discomfort. PMH: sickle cell trait, acquired hypothyroidism    PT Comments  Pt AxO x 3 pleasant and willing.  Pt stated she was at a [employer] game when she had to call 911 due to severe back pain and inability to move. Assisted OOB to amb with progress.  Pt self able to get OOB and rise.  General transfer comment: self able with increased time with good use of hands to steady self.  Tolerated an increased distance.  General Gait Details: amb with out an AD this session.  Pt using wall rails approx 50% of time to steadt self.  Slightly forward flexed posture.  Slow/sluggish/shuffled steps.  Pt would benefit from a cane. Pt plans to return home when medically cleared.  LPT has rec NO PT f/u needs.     If plan is discharge home, recommend the following: Help with stairs or ramp for entrance   Can travel by private vehicle        Equipment Recommendations  Cane    Recommendations for Other Services       Precautions / Restrictions Precautions Precautions: Fall Restrictions Weight Bearing Restrictions Per Provider Order: No     Mobility  Bed Mobility Overal bed mobility: Modified Independent             General bed mobility comments: self able with increased time    Transfers Overall transfer level: Needs assistance Equipment used: None   Sit to Stand: Supervision           General transfer comment: self able with increased time with good use of hands to steady self    Ambulation/Gait Ambulation/Gait assistance: Supervision Gait Distance (Feet): 135 Feet Assistive device: None Gait Pattern/deviations: Step-through pattern,  Decreased stride length, Trunk flexed, Narrow base of support Gait velocity: decreased     General Gait Details: amb with out an AD this session.  Pt using wall rails approx 50% of time to steadt self.  Slightly forward flexed posture.  Slow/sluggish/shuffled steps.  Pt would benefit from a cane.   Stairs             Wheelchair Mobility     Tilt Bed    Modified Rankin (Stroke Patients Only)       Balance                                            Communication Communication Communication: No apparent difficulties  Cognition Arousal: Alert     PT - Cognitive impairments: No apparent impairments                       PT - Cognition Comments: AxO x 3 pleasant and willing Following commands: Intact      Cueing Cueing Techniques: Verbal cues  Exercises      General Comments        Pertinent Vitals/Pain Pain Assessment Pain Assessment: Faces Faces Pain Scale: Hurts little more Pain Location: back Pain Descriptors / Indicators: Grimacing, Constant Pain Intervention(s): Monitored during session    Home Living  Prior Function            PT Goals (current goals can now be found in the care plan section) Progress towards PT goals: Progressing toward goals    Frequency    Min 2X/week      PT Plan      Co-evaluation              AM-PAC PT "6 Clicks" Mobility   Outcome Measure  Help needed turning from your back to your side while in a flat bed without using bedrails?: None Help needed moving from lying on your back to sitting on the side of a flat bed without using bedrails?: None Help needed moving to and from a bed to a chair (including a wheelchair)?: None Help needed standing up from a chair using your arms (e.g., wheelchair or bedside chair)?: None Help needed to walk in hospital room?: None Help needed climbing 3-5 steps with a railing? : A Little 6 Click Score: 23     End of Session Equipment Utilized During Treatment: Gait belt Activity Tolerance: Patient tolerated treatment well Patient left: in bed;with call bell/phone within reach;with bed alarm set   PT Visit Diagnosis: Other abnormalities of gait and mobility (R26.89)     Time: 1442-1500 PT Time Calculation (min) (ACUTE ONLY): 18 min  Charges:    $Gait Training: 8-22 mins PT General Charges $$ ACUTE PT VISIT: 1 Visit                     Bess Broody  PTA Acute  Rehabilitation Services Office M-F          [phone]

## 2024-02-13 NOTE — Progress Notes (Signed)
 Patient ID: Sylvia Vega, female   DOB: [birth year], 38 y.o.   MRN: [id number] Subjective: Sylvia Vega is a 38 y.o. female with medical history significant for sickle cell trait, acquired hypothyroidism, depression & Anxiety, Essential hypertension, Acquired hypothyroidism who is admitted with severe sepsis due to right-sided pyelonephritis.  Patient denies Nausea, chest pain, cough and shortness of breath today. She continues to have right sided pain and rates it at  8/10.   Objective:  Vital signs in last 24 hours:  Vitals:   02/13/24 0440 02/13/24 0500 02/13/24 0728 02/13/24 1314  BP: (!) 119/98  (!) 124/92 112/82  Pulse: 95  (!) 102 94  Resp: 19  16 18   Temp: 100.2 F (37.9 C)  98.9 F (37.2 C) 98.7 F (37.1 C)  TempSrc: Oral  Oral Oral  SpO2: 96%  95% 96%  Weight:  70.7 kg    Height:        Intake/Output from previous day:   Intake/Output Summary (Last 24 hours) at 02/13/2024 1513 Last data filed at 02/13/2024 1015 Gross per 24 hour  Intake 220 ml  Output --  Net 220 ml    Physical Exam: General: Alert, awake, oriented x3, in no acute distress.  HEENT: [place]/AT PEERL, EOMI Neck: Trachea midline,  no masses, no thyromegal,y no JVD, no carotid bruit OROPHARYNX:  Moist, No exudate/ erythema/lesions.  Heart: Regular rate and rhythm, without murmurs, rubs, gallops, PMI non-displaced, no heaves or thrills on palpation.  Lungs: Clear to auscultation, no wheezing or rhonchi noted. No increased vocal fremitus resonant to percussion  Abdomen: Soft, nontender, nondistended, positive bowel sounds, no masses no hepatosplenomegaly noted..  Neuro: No focal neurological deficits noted cranial nerves II through XII grossly intact. DTRs 2+ bilaterally upper and lower extremities. Strength 5 out of 5 in bilateral upper and lower extremities. GU: Urinary urgency Musculoskeletal: Right flank pain  Psychiatric: Patient alert and oriented x3, good insight and cognition, good recent to remote  recall. Lymph node survey: No cervical axillary or inguinal lymphadenopathy noted.  Lab Results:  Basic Metabolic Panel:    Component Value Date/Time   NA 138 02/11/2024 0242   K 3.4 (L) 02/11/2024 0242   CL 105 02/11/2024 0242   CO2 26 02/11/2024 0242   BUN 8 02/11/2024 0242   CREATININE 0.64 02/11/2024 0242   GLUCOSE 105 (H) 02/11/2024 0242   CALCIUM 8.5 (L) 02/11/2024 0242   CBC:    Component Value Date/Time   WBC 14.2 (H) 02/11/2024 0242   HGB 11.7 (L) 02/11/2024 0242   HCT 34.3 (L) 02/11/2024 0242   PLT 127 (L) 02/11/2024 0242   MCV 94.2 02/11/2024 0242   NEUTROABS 11.7 (H) 02/11/2024 0242   LYMPHSABS 1.3 02/11/2024 0242   MONOABS 1.0 02/11/2024 0242   EOSABS 0.0 02/11/2024 0242   BASOSABS 0.0 02/11/2024 0242    Recent Results (from the past 240 hours)  Culture, blood (Routine x 2)     Status: Abnormal   Collection Time: 02/10/24  8:37 PM   Specimen: BLOOD LEFT ARM  Result Value Ref Range Status   Specimen Description   Final    BLOOD LEFT ARM Performed at [facility], [address], [place], [place] [address]    Special Requests   Final    BOTTLES DRAWN AEROBIC AND ANAEROBIC Blood Culture adequate volume Performed at [facility], [address] [place], [place] [address]    Culture  Setup Time   Final  GRAM NEGATIVE RODS IN BOTH AEROBIC AND ANAEROBIC BOTTLES CRITICAL RESULT CALLED TO, READ BACK BY AND VERIFIED WITH: Glennis Lansing [facility] [id number] [id number] BY Chestine Costain, [place] Performed at [facility], [address], [place], [place] [address]    Culture ESCHERICHIA COLI (A)  Final   Report Status 02/13/2024 FINAL  Final   Organism ID, Bacteria ESCHERICHIA COLI  Final   Organism ID, Bacteria ESCHERICHIA COLI  Final      Susceptibility   Escherichia coli - KIRBY BAUER*    CEFAZOLIN INTERMEDIATE Intermediate    Escherichia coli - MIC*    AMPICILLIN >=32 RESISTANT Resistant     CEFEPIME <=0.12 SENSITIVE Sensitive      CEFTAZIDIME <=1 SENSITIVE Sensitive     CEFTRIAXONE <=0.25 SENSITIVE Sensitive     CIPROFLOXACIN <=0.25 SENSITIVE Sensitive     GENTAMICIN <=1 SENSITIVE Sensitive     IMIPENEM <=0.25 SENSITIVE Sensitive     TRIMETH/SULFA >=320 RESISTANT Resistant     AMPICILLIN/SULBACTAM 16 INTERMEDIATE Intermediate     PIP/TAZO <=4 SENSITIVE Sensitive ug/mL    * ESCHERICHIA COLI    ESCHERICHIA COLI  Blood Culture ID Panel (Reflexed)     Status: Abnormal   Collection Time: 02/10/24  8:37 PM  Result Value Ref Range Status   Enterococcus faecalis NOT DETECTED NOT DETECTED Final   Enterococcus Faecium NOT DETECTED NOT DETECTED Final   Listeria monocytogenes NOT DETECTED NOT DETECTED Final   Staphylococcus species NOT DETECTED NOT DETECTED Final   Staphylococcus aureus (BCID) NOT DETECTED NOT DETECTED Final   Staphylococcus epidermidis NOT DETECTED NOT DETECTED Final   Staphylococcus lugdunensis NOT DETECTED NOT DETECTED Final   Streptococcus species NOT DETECTED NOT DETECTED Final   Streptococcus agalactiae NOT DETECTED NOT DETECTED Final   Streptococcus pneumoniae NOT DETECTED NOT DETECTED Final   Streptococcus pyogenes NOT DETECTED NOT DETECTED Final   A.calcoaceticus-baumannii NOT DETECTED NOT DETECTED Final   Bacteroides fragilis NOT DETECTED NOT DETECTED Final   Enterobacterales DETECTED (A) NOT DETECTED Final    Comment: Enterobacterales represent a large order of gram negative bacteria, not a single organism. CRITICAL RESULT CALLED TO, READ BACK BY AND VERIFIED WITH: PHARMD DREW WOFFORD [phone] [id number] BY J RAZZAK, MT    Enterobacter cloacae complex NOT DETECTED NOT DETECTED Final   Escherichia coli DETECTED (A) NOT DETECTED Final    Comment: CRITICAL RESULT CALLED TO, READ BACK BY AND VERIFIED WITH: Glennis Lansing [facility] [phone] 1207 BY J RAZZAK, MT    Klebsiella aerogenes NOT DETECTED NOT DETECTED Final   Klebsiella oxytoca NOT DETECTED NOT DETECTED Final   Klebsiella pneumoniae NOT DETECTED  NOT DETECTED Final   Proteus species NOT DETECTED NOT DETECTED Final   Salmonella species NOT DETECTED NOT DETECTED Final   Serratia marcescens NOT DETECTED NOT DETECTED Final   Haemophilus influenzae NOT DETECTED NOT DETECTED Final   Neisseria meningitidis NOT DETECTED NOT DETECTED Final   Pseudomonas aeruginosa NOT DETECTED NOT DETECTED Final   Stenotrophomonas maltophilia NOT DETECTED NOT DETECTED Final   Candida albicans NOT DETECTED NOT DETECTED Final   Candida auris NOT DETECTED NOT DETECTED Final   Candida glabrata NOT DETECTED NOT DETECTED Final   Candida krusei NOT DETECTED NOT DETECTED Final   Candida parapsilosis NOT DETECTED NOT DETECTED Final   Candida tropicalis NOT DETECTED NOT DETECTED Final   Cryptococcus neoformans/gattii NOT DETECTED NOT DETECTED Final   CTX-M ESBL NOT DETECTED NOT DETECTED Final   Carbapenem resistance IMP NOT DETECTED NOT DETECTED Final  Carbapenem resistance KPC NOT DETECTED NOT DETECTED Final   Carbapenem resistance NDM NOT DETECTED NOT DETECTED Final   Carbapenem resist OXA 48 LIKE NOT DETECTED NOT DETECTED Final   Carbapenem resistance VIM NOT DETECTED NOT DETECTED Final    Comment: Performed at [facility], [address], [place], [place] [address]  Resp panel by RT-PCR (RSV, Flu A&B, Covid) Anterior Nasal Swab     Status: None   Collection Time: 02/10/24  8:51 PM   Specimen: Anterior Nasal Swab  Result Value Ref Range Status   SARS Coronavirus 2 by RT PCR NEGATIVE NEGATIVE Final    Comment: (NOTE) SARS-CoV-2 target nucleic acids are NOT DETECTED.  The SARS-CoV-2 RNA is generally detectable in upper respiratory specimens during the acute phase of infection. The lowest concentration of SARS-CoV-2 viral copies this assay can detect is 138 copies/mL. A negative result does not preclude SARS-Cov-2 infection and should not be used as the sole basis for treatment or other patient management decisions. A negative result may occur with   improper specimen collection/handling, submission of specimen other than nasopharyngeal swab, presence of viral mutation(s) within the areas targeted by this assay, and inadequate number of viral copies(<138 copies/mL). A negative result must be combined with clinical observations, patient history, and epidemiological information. The expected result is Negative.  Fact Sheet for Patients:  BloggerCourse.com  Fact Sheet for Healthcare Providers:  SeriousBroker.it  This test is no t yet approved or cleared by the [place]  FDA and  has been authorized for detection and/or diagnosis of SARS-CoV-2 by FDA under an Emergency Use Authorization (EUA). This EUA will remain  in effect (meaning this test can be used) for the duration of the COVID-19 declaration under Section 564(b)(1) of the Act, 21 U.S.C.section 360bbb-3(b)(1), unless the authorization is terminated  or revoked sooner.       Influenza A by PCR NEGATIVE NEGATIVE Final   Influenza B by PCR NEGATIVE NEGATIVE Final    Comment: (NOTE) The Xpert Xpress SARS-CoV-2/FLU/RSV plus assay is intended as an aid in the diagnosis of influenza from Nasopharyngeal swab specimens and should not be used as a sole basis for treatment. Nasal washings and aspirates are unacceptable for Xpert Xpress SARS-CoV-2/FLU/RSV testing.  Fact Sheet for Patients: BloggerCourse.com  Fact Sheet for Healthcare Providers: SeriousBroker.it  This test is not yet approved or cleared by the [place]  FDA and has been authorized for detection and/or diagnosis of SARS-CoV-2 by FDA under an Emergency Use Authorization (EUA). This EUA will remain in effect (meaning this test can be used) for the duration of the COVID-19 declaration under Section 564(b)(1) of the Act, 21 U.S.C. section 360bbb-3(b)(1), unless the authorization is terminated or revoked.      Resp Syncytial Virus by PCR NEGATIVE NEGATIVE Final    Comment: (NOTE) Fact Sheet for Patients: BloggerCourse.com  Fact Sheet for Healthcare Providers: SeriousBroker.it  This test is not yet approved or cleared by the [place]  FDA and has been authorized for detection and/or diagnosis of SARS-CoV-2 by FDA under an Emergency Use Authorization (EUA). This EUA will remain in effect (meaning this test can be used) for the duration of the COVID-19 declaration under Section 564(b)(1) of the Act, 21 U.S.C. section 360bbb-3(b)(1), unless the authorization is terminated or revoked.  Performed at [facility], [address] [place], [place] [address]   Culture, blood (Routine x 2)     Status: None (Preliminary result)   Collection Time: 02/10/24  9:29 PM  Specimen: BLOOD RIGHT FOREARM  Result Value Ref Range Status   Specimen Description   Final    BLOOD RIGHT FOREARM Performed at [facility], [address] [place], [place] [address]    Special Requests   Final    BOTTLES DRAWN AEROBIC AND ANAEROBIC Blood Culture results may not be optimal due to an inadequate volume of blood received in culture bottles Performed at [facility], [address], [place], [place] [address]    Culture   Final    NO GROWTH 3 DAYS Performed at [facility], [address], [place], [place] [address]    Report Status PENDING  Incomplete  Urine Culture     Status: Abnormal   Collection Time: 02/10/24 10:01 PM   Specimen: Urine, Random  Result Value Ref Range Status   Specimen Description   Final    URINE, RANDOM Performed at [facility], [address] [place], [place] [address]    Special Requests   Final    NONE Reflexed from [id number] Performed at [facility], [address] [place], [place] [address]    Culture (A)  Final    <10,000 COLONIES/mL  INSIGNIFICANT GROWTH Performed at [facility], [address], [place], [place] [address]    Report Status 02/12/2024 FINAL  Final    Studies/Results: No results found.  Medications: Scheduled Meds:  ARIPiprazole  2 mg Oral Daily   clonazePAM  0.5 mg Oral Daily   metoprolol succinate  25 mg Oral Daily   venlafaxine XR  150 mg Oral Daily   Continuous Infusions:  cefTRIAXone (ROCEPHIN)  IV 2 g (02/12/24 1808)   diphenhydrAMINE  Stopped (02/13/24 0015)   promethazine (PHENERGAN) injection (IM or IVPB) Stopped (02/13/24 0515)   PRN Meds:.acetaminophen **OR** acetaminophen, dextrose , diphenhydrAMINE , HYDROmorphone  (DILAUDID ) injection, LORazepam, melatonin, naLOXone (NARCAN)  injection, oxyCODONE, promethazine (PHENERGAN) injection (IM or IVPB)  Consultants: None   Procedures: None  Antibiotics: Rocephin  Assessment/Plan: Principal Problem:   Pyelonephritis of right kidney Active Problems:   Severe sepsis (HCC)   Essential hypertension   Acute right flank pain   Hypokalemia   Hypoglycemia   Depression   Acquired hypothyroidism   Acute pyelonephritis of the right kidney: Currently on IV antibiotics.  Cultures pending.  Continue antibiotics and supportive treatment Reported sickle cell disease: There is conflicting report as to whether patient has sickle cell anemia beta thalassemia to be exact or just sickle cell trait. Sickle cell electrophoresis labs ordered results pending.   Essential hypertension: Continue blood pressure medications. Hypokalemia: Continue to replace potassium Depression with anxiety: Continue home regimen  Code Status: Full Code Family Communication: N/A Disposition Plan: Not yet ready for discharge  Lorel Roes NP   If 7PM-7AM, please contact night-coverage.  02/13/2024, 3:13 PM  LOS: 3 days

## 2024-02-13 NOTE — Plan of Care (Signed)

## 2024-02-14 LAB — HGB FRACTIONATION CASCADE
Hgb A2: 3.6 % — ABNORMAL HIGH (ref 1.8–3.2)
Hgb A: 52.7 % — ABNORMAL LOW (ref 96.4–98.8)
Hgb F: 1.4 % (ref 0.0–2.0)
Hgb S: 42.3 % — ABNORMAL HIGH

## 2024-02-14 LAB — HGB SOLUBILITY: Hgb Solubility: POSITIVE — AB

## 2024-02-14 NOTE — Progress Notes (Signed)
   02/14/24 1910  What Happened  Was fall witnessed? No  Was patient injured? No  Patient found other (Comment) (standing in bathroom)  Found by Staff-comment (Crystal NT)  Stated prior activity bathroom-unassisted  Provider Notification  Provider Name/Title Guillermo Lees, NP  Date Provider Notified 02/14/24  Time Provider Notified 1935  Method of Notification Page  Notification Reason Fall  Follow Up  Family notified No - patient refusal  Adult Fall Risk Assessment  Risk Factor Category (scoring not indicated) Fall has occurred during this admission (document High fall risk)  Age 38  Fall History: Fall within 6 months prior to admission 0  Elimination; Bowel and/or Urine Incontinence 0  Elimination; Bowel and/or Urine Urgency/Frequency 0  Medications: includes PCA/Opiates, Anti-convulsants, Anti-hypertensives, Diuretics, Hypnotics, Laxatives, Sedatives, and Psychotropics 5  Patient Care Equipment 1  Mobility-Assistance 0  Mobility-Gait 0  Mobility-Sensory Deficit 0  Altered awareness of immediate physical environment 0  Impulsiveness 0  Lack of understanding of one's physical/cognitive limitations 0  Total Score 6  Patient Fall Risk Level High fall risk  Adult Fall Risk Interventions  Required Bundle Interventions *See Row Information* High fall risk - low, moderate, and high requirements implemented  Screening for Fall Injury Risk (To be completed on HIGH fall risk patients) - Assessing Need for Floor Mats  Risk For Fall Injury- Criteria for Floor Mats Previous fall this admission  Vitals  Temp 98.7 F (37.1 C)  Temp Source Oral  BP (!) 137/99  MAP (mmHg) 111  BP Location Right Arm  BP Method Automatic  Patient Position (if appropriate) Lying  Pulse Rate 85  Resp 20  Oxygen Therapy  SpO2 98 %  O2 Device Room Air  Pain Assessment  Pain Scale 0-10  Pain Score 5  Pain Location Back  Pain Orientation Lower  Neurological  Neuro (WDL) WDL  Orientation Level Oriented X4   Glasgow Coma Scale  Eye Opening 4  Best Verbal Response (NON-intubated) 5  Best Motor Response 6  Glasgow Coma Scale Score 15  Musculoskeletal  Musculoskeletal (WDL) WDL  Integumentary  Integumentary (WDL) WDL   Around 1900, pt told NT that she had fallen in the bathroom while trying to wash.  Pt had yellow socks and was low fall risk at the time.  Pt stated she hit her bottom/low back.  Vital signs stable.  No redness or broken skin.  Pt returned safely to bed, bed alarm set.  Pt education provided.  High fall risk protocol initiated.

## 2024-02-14 NOTE — TOC Progression Note (Signed)
 Transition of Care [facility]) - Progression Note   Patient Details  Name: Sylvia Vega MRN: [id number] Date of Birth: [birth year]  [facility] of [facility]) CM/SW Contact  Zenon Hilda, LCSW Phone Number: 02/14/2024, 10:01 AM  Clinical Narrative: Patient will need a single point cane. Patient agreeable to having a cane delivered to her room. Patient does not have a DME company preference. CSW made DME referral to Zack with [employer]. [employer] to deliver cane to patient's room.  Expected Discharge Plan: Home/Self Care Barriers to Discharge: Continued Medical Work up  Expected Discharge Plan and Services In-house Referral: Clinical Social Work Living arrangements for the past 2 months: Single Family Home          DME Arranged: Jeananne Mighty DME Agency: [employer] Date DME Agency Contacted: 02/14/24 Time DME Agency Contacted: [phone] Representative spoke with at DME Agency: Zack  Social Determinants of Health (SDOH) Interventions SDOH Screenings   Food Insecurity: No Food Insecurity (02/11/2024)  Housing: Unknown (02/11/2024)  Transportation Needs: No Transportation Needs (02/11/2024)  Utilities: Not At Risk (02/11/2024)  Financial Resource Strain: Low Risk  (11/24/2022)   [facility] from [facility]  Physical Activity: Insufficiently Active (07/11/2022)   [facility] from [facility]  Social Connections: Moderately Isolated (07/11/2022)   [facility] from [facility]  Stress: No Stress Concern Present (07/11/2022)   [facility] from [facility]  Tobacco Use: High Risk (02/10/2024)  Health Literacy: Low Risk  (01/16/2021)   Received from [facility]   Readmission Risk Interventions     No data to display

## 2024-02-14 NOTE — Progress Notes (Signed)
 Mobility Specialist - Progress Note   02/14/24 1434  Mobility  Activity Off unit;Ambulated with assistance in hallway  Level of Assistance Standby assist, set-up cues, supervision of patient - no hands on  Assistive Device [place];Wheelchair  Distance Ambulated (ft) 250 ft  Activity Response Tolerated well  Mobility Referral Yes  Mobility visit 1 Mobility  Mobility Specialist Start Time (ACUTE ONLY) 1328  Mobility Specialist Stop Time (ACUTE ONLY) 1433  Mobility Specialist Time Calculation (min) (ACUTE ONLY) 65 min   Pt received in bed and agreeable to go outside. Pt tolerated sitting outside for ~58min. Upon returning, pt ambulated ~250 with cane. No complaints during session. Pt to bed and no complaints during session.   [facility]

## 2024-02-14 NOTE — Progress Notes (Signed)
 Patient ID: Sylvia Vega, female   DOB: 1986/02/09, 38 y.o.   MRN: [id number] Subjective: Sylvia Vega is a 38 y.o. female with medical history significant for sickle cell trait, acquired hypothyroidism, depression & Anxiety, Essential hypertension, Acquired hypothyroidism who is admitted with severe sepsis due to right-sided pyelonephritis.  Patient denies Nausea, chest pain, cough and shortness of breath today. She reports improved right sided pain today and rates it at 7/10.     Objective:  Vital signs in last 24 hours:  Vitals:   02/13/24 0728 02/13/24 1314 02/13/24 1953 02/14/24 0556  BP: (!) 124/92 112/82 131/89 119/82  Pulse: (!) 102 94 96 83  Resp: 16 18 18 16   Temp: 98.9 F (37.2 C) 98.7 F (37.1 C) 98 F (36.7 C) 98.4 F (36.9 C)  TempSrc: Oral Oral  Oral  SpO2: 95% 96% 94% 94%  Weight:      Height:        Intake/Output from previous day:  No intake or output data in the 24 hours ending 02/14/24 1340  Physical Exam: General: Alert, awake, oriented x3, in no acute distress.  HEENT: Cucumber/AT PEERL, EOMI Neck: Trachea midline,  no masses, no thyromegal,y no JVD, no carotid bruit OROPHARYNX:  Moist, No exudate/ erythema/lesions.  Heart: Regular rate and rhythm, without murmurs, rubs, gallops, PMI non-displaced, no heaves or thrills on palpation.  Lungs: Clear to auscultation, no wheezing or rhonchi noted. No increased vocal fremitus resonant to percussion  Abdomen: Soft, nontender, nondistended, positive bowel sounds, no masses no hepatosplenomegaly noted..  Neuro: No focal neurological deficits noted cranial nerves II through XII grossly intact. DTRs 2+ bilaterally upper and lower extremities. Strength 5 out of 5 in bilateral upper and lower extremities. Musculoskeletal: Right flank pain  Psychiatric: Patient alert and oriented x3, good insight and cognition, good recent to remote recall. Lymph node survey: No cervical axillary or inguinal lymphadenopathy noted.  Lab  Results:  Basic Metabolic Panel:    Component Value Date/Time   NA 138 02/11/2024 0242   K 3.4 (L) 02/11/2024 0242   CL 105 02/11/2024 0242   CO2 26 02/11/2024 0242   BUN 8 02/11/2024 0242   CREATININE 0.64 02/11/2024 0242   GLUCOSE 105 (H) 02/11/2024 0242   CALCIUM 8.5 (L) 02/11/2024 0242   CBC:    Component Value Date/Time   WBC 14.2 (H) 02/11/2024 0242   HGB 11.7 (L) 02/11/2024 0242   HCT 34.3 (L) 02/11/2024 0242   PLT 127 (L) 02/11/2024 0242   MCV 94.2 02/11/2024 0242   NEUTROABS 11.7 (H) 02/11/2024 0242   LYMPHSABS 1.3 02/11/2024 0242   MONOABS 1.0 02/11/2024 0242   EOSABS 0.0 02/11/2024 0242   BASOSABS 0.0 02/11/2024 0242    Recent Results (from the past 240 hours)  Culture, blood (Routine x 2)     Status: Abnormal   Collection Time: 02/10/24  8:37 PM   Specimen: BLOOD LEFT ARM  Result Value Ref Range Status   Specimen Description   Final    BLOOD LEFT ARM Performed at [facility], [address], [place], [place] [address]    Special Requests   Final    BOTTLES DRAWN AEROBIC AND ANAEROBIC Blood Culture adequate volume Performed at [facility], [address] [place], [place] [address]    Culture  Setup Time   Final    GRAM NEGATIVE RODS IN BOTH AEROBIC AND ANAEROBIC BOTTLES CRITICAL RESULT CALLED TO, READ BACK BY AND VERIFIED WITH: PHARMD DREW  [facility] [id number] BY Chestine Costain, MT Performed at [facility], [address] [place], [place] [address]    Culture ESCHERICHIA COLI (A)  Final   Report Status 02/13/2024 FINAL  Final   Organism ID, Bacteria ESCHERICHIA COLI  Final   Organism ID, Bacteria ESCHERICHIA COLI  Final      Susceptibility   Escherichia coli - KIRBY BAUER*    CEFAZOLIN INTERMEDIATE Intermediate    Escherichia coli - MIC*    AMPICILLIN >=32 RESISTANT Resistant     CEFEPIME <=0.12 SENSITIVE Sensitive     CEFTAZIDIME <=1 SENSITIVE Sensitive     CEFTRIAXONE <=0.25 SENSITIVE Sensitive     CIPROFLOXACIN  <=0.25 SENSITIVE Sensitive     GENTAMICIN <=1 SENSITIVE Sensitive     IMIPENEM <=0.25 SENSITIVE Sensitive     TRIMETH/SULFA >=320 RESISTANT Resistant     AMPICILLIN/SULBACTAM 16 INTERMEDIATE Intermediate     PIP/TAZO <=4 SENSITIVE Sensitive ug/mL    * ESCHERICHIA COLI    ESCHERICHIA COLI  Blood Culture ID Panel (Reflexed)     Status: Abnormal   Collection Time: 02/10/24  8:37 PM  Result Value Ref Range Status   Enterococcus faecalis NOT DETECTED NOT DETECTED Final   Enterococcus Faecium NOT DETECTED NOT DETECTED Final   Listeria monocytogenes NOT DETECTED NOT DETECTED Final   Staphylococcus species NOT DETECTED NOT DETECTED Final   Staphylococcus aureus (BCID) NOT DETECTED NOT DETECTED Final   Staphylococcus epidermidis NOT DETECTED NOT DETECTED Final   Staphylococcus lugdunensis NOT DETECTED NOT DETECTED Final   Streptococcus species NOT DETECTED NOT DETECTED Final   Streptococcus agalactiae NOT DETECTED NOT DETECTED Final   Streptococcus pneumoniae NOT DETECTED NOT DETECTED Final   Streptococcus pyogenes NOT DETECTED NOT DETECTED Final   A.calcoaceticus-baumannii NOT DETECTED NOT DETECTED Final   Bacteroides fragilis NOT DETECTED NOT DETECTED Final   Enterobacterales DETECTED (A) NOT DETECTED Final    Comment: Enterobacterales represent a large order of gram negative bacteria, not a single organism. CRITICAL RESULT CALLED TO, READ BACK BY AND VERIFIED WITH: PHARMD DREW WOFFORD [phone] [id number] BY J RAZZAK, MT    Enterobacter cloacae complex NOT DETECTED NOT DETECTED Final   Escherichia coli DETECTED (A) NOT DETECTED Final    Comment: CRITICAL RESULT CALLED TO, READ BACK BY AND VERIFIED WITH: [facility] [id number] [id number] BY J RAZZAK, MT    Klebsiella aerogenes NOT DETECTED NOT DETECTED Final   Klebsiella oxytoca NOT DETECTED NOT DETECTED Final   Klebsiella pneumoniae NOT DETECTED NOT DETECTED Final   Proteus species NOT DETECTED NOT DETECTED Final   Salmonella species NOT  DETECTED NOT DETECTED Final   Serratia marcescens NOT DETECTED NOT DETECTED Final   Haemophilus influenzae NOT DETECTED NOT DETECTED Final   Neisseria meningitidis NOT DETECTED NOT DETECTED Final   Pseudomonas aeruginosa NOT DETECTED NOT DETECTED Final   Stenotrophomonas maltophilia NOT DETECTED NOT DETECTED Final   Candida albicans NOT DETECTED NOT DETECTED Final   Candida auris NOT DETECTED NOT DETECTED Final   Candida glabrata NOT DETECTED NOT DETECTED Final   Candida krusei NOT DETECTED NOT DETECTED Final   Candida parapsilosis NOT DETECTED NOT DETECTED Final   Candida tropicalis NOT DETECTED NOT DETECTED Final   Cryptococcus neoformans/gattii NOT DETECTED NOT DETECTED Final   CTX-M ESBL NOT DETECTED NOT DETECTED Final   Carbapenem resistance IMP NOT DETECTED NOT DETECTED Final   Carbapenem resistance KPC NOT DETECTED NOT DETECTED Final   Carbapenem resistance NDM NOT DETECTED NOT DETECTED Final   Carbapenem  resist OXA 48 LIKE NOT DETECTED NOT DETECTED Final   Carbapenem resistance VIM NOT DETECTED NOT DETECTED Final    Comment: Performed at [facility], [address], [place], [place] [address]  Resp panel by RT-PCR (RSV, Flu A&B, Covid) Anterior Nasal Swab     Status: None   Collection Time: 02/10/24  8:51 PM   Specimen: Anterior Nasal Swab  Result Value Ref Range Status   SARS Coronavirus 2 by RT PCR NEGATIVE NEGATIVE Final    Comment: (NOTE) SARS-CoV-2 target nucleic acids are NOT DETECTED.  The SARS-CoV-2 RNA is generally detectable in upper respiratory specimens during the acute phase of infection. The lowest concentration of SARS-CoV-2 viral copies this assay can detect is 138 copies/mL. A negative result does not preclude SARS-Cov-2 infection and should not be used as the sole basis for treatment or other patient management decisions. A negative result may occur with  improper specimen collection/handling, submission of specimen other than nasopharyngeal swab,  presence of viral mutation(s) within the areas targeted by this assay, and inadequate number of viral copies(<138 copies/mL). A negative result must be combined with clinical observations, patient history, and epidemiological information. The expected result is Negative.  Fact Sheet for Patients:  BloggerCourse.com  Fact Sheet for Healthcare Providers:  SeriousBroker.it  This test is no t yet approved or cleared by the [place]  FDA and  has been authorized for detection and/or diagnosis of SARS-CoV-2 by FDA under an Emergency Use Authorization (EUA). This EUA will remain  in effect (meaning this test can be used) for the duration of the COVID-19 declaration under Section 564(b)(1) of the Act, 21 U.S.C.section 360bbb-3(b)(1), unless the authorization is terminated  or revoked sooner.       Influenza A by PCR NEGATIVE NEGATIVE Final   Influenza B by PCR NEGATIVE NEGATIVE Final    Comment: (NOTE) The Xpert Xpress SARS-CoV-2/FLU/RSV plus assay is intended as an aid in the diagnosis of influenza from Nasopharyngeal swab specimens and should not be used as a sole basis for treatment. Nasal washings and aspirates are unacceptable for Xpert Xpress SARS-CoV-2/FLU/RSV testing.  Fact Sheet for Patients: BloggerCourse.com  Fact Sheet for Healthcare Providers: SeriousBroker.it  This test is not yet approved or cleared by the [place]  FDA and has been authorized for detection and/or diagnosis of SARS-CoV-2 by FDA under an Emergency Use Authorization (EUA). This EUA will remain in effect (meaning this test can be used) for the duration of the COVID-19 declaration under Section 564(b)(1) of the Act, 21 U.S.C. section 360bbb-3(b)(1), unless the authorization is terminated or revoked.     Resp Syncytial Virus by PCR NEGATIVE NEGATIVE Final    Comment: (NOTE) Fact Sheet for  Patients: BloggerCourse.com  Fact Sheet for Healthcare Providers: SeriousBroker.it  This test is not yet approved or cleared by the [place]  FDA and has been authorized for detection and/or diagnosis of SARS-CoV-2 by FDA under an Emergency Use Authorization (EUA). This EUA will remain in effect (meaning this test can be used) for the duration of the COVID-19 declaration under Section 564(b)(1) of the Act, 21 U.S.C. section 360bbb-3(b)(1), unless the authorization is terminated or revoked.  Performed at [facility], [address], [place], [place] [address]   Culture, blood (Routine x 2)     Status: None (Preliminary result)   Collection Time: 02/10/24  9:29 PM   Specimen: BLOOD RIGHT FOREARM  Result Value Ref Range Status   Specimen Description   Final  BLOOD RIGHT FOREARM Performed at [facility], [address] [place], [place] [address]    Special Requests   Final    BOTTLES DRAWN AEROBIC AND ANAEROBIC Blood Culture results may not be optimal due to an inadequate volume of blood received in culture bottles Performed at [facility], [address], [place], [place] [address]    Culture   Final    NO GROWTH 4 DAYS Performed at [facility], [address] [place], [place] [address]    Report Status PENDING  Incomplete  Urine Culture     Status: Abnormal   Collection Time: 02/10/24 10:01 PM   Specimen: Urine, Random  Result Value Ref Range Status   Specimen Description   Final    URINE, RANDOM Performed at [facility], [address] [place], [place] [address]    Special Requests   Final    NONE Reflexed from [id number] Performed at [facility], [address], [place], [place] [address]    Culture (A)  Final    <10,000 COLONIES/mL INSIGNIFICANT GROWTH Performed at [facility], [address], [place], [place]  [address]    Report Status 02/12/2024 FINAL  Final    Studies/Results: No results found.  Medications: Scheduled Meds:  ARIPiprazole  2 mg Oral Daily   clonazePAM  0.5 mg Oral Daily   metoprolol succinate  25 mg Oral Daily   venlafaxine XR  150 mg Oral Daily   Continuous Infusions:  cefTRIAXone (ROCEPHIN)  IV 2 g (02/13/24 1759)   diphenhydrAMINE  25 mg (02/13/24 1758)   promethazine (PHENERGAN) injection (IM or IVPB) 12.5 mg (02/13/24 1841)   PRN Meds:.acetaminophen **OR** acetaminophen, dextrose , diphenhydrAMINE , HYDROmorphone  (DILAUDID ) injection, LORazepam, melatonin, naLOXone (NARCAN)  injection, oxyCODONE, promethazine (PHENERGAN) injection (IM or IVPB)  Consultants: None  Procedures: None  Antibiotics: Rocephin  Assessment/Plan: Principal Problem:   Pyelonephritis of right kidney Active Problems:   Severe sepsis (HCC)   Essential hypertension   Acute right flank pain   Hypokalemia   Hypoglycemia   Depression   Acquired hypothyroidism  Acute pyelonephritis of the right kidney: Currently on IV antibiotics.  Cultures pending.  Continue antibiotics and supportive treatment Reported sickle cell disease: There is conflicting report as to whether patient has sickle cell anemia beta thalassemia to be exact or just sickle cell trait. Sickle cell electrophoresis labs ordered results pending.   Essential hypertension: Continue blood pressure medications. Hypokalemia: Continue to replace potassium Depression with anxiety: Continue home regimen     Code Status: Full Code Family Communication: N/A Disposition Plan: Patient will be ready for discharge tomorrow  Lorel Roes NP   If 7PM-7AM, please contact night-coverage.  02/14/2024, 1:40 PM  LOS: 4 days

## 2024-02-14 NOTE — Plan of Care (Signed)
  Problem: Clinical Measurements: Goal: Ability to maintain clinical measurements within normal limits will improve Outcome: Progressing Goal: Diagnostic test results will improve Outcome: Progressing Goal: Respiratory complications will improve Outcome: Progressing Goal: Cardiovascular complication will be avoided Outcome: Progressing   Problem: Coping: Goal: Level of anxiety will decrease Outcome: Progressing   Problem: Pain Managment: Goal: General experience of comfort will improve and/or be controlled Outcome: Progressing   Problem: Safety: Goal: Ability to remain free from injury will improve Outcome: Progressing

## 2024-02-14 NOTE — Progress Notes (Signed)
 Mobility Specialist - Progress Note   02/14/24 1124  Mobility  Activity Ambulated with assistance in hallway  Level of Assistance Contact guard assist, steadying assist  Assistive Device Cane  Distance Ambulated (ft) 80 ft  Activity Response Tolerated well  Mobility Referral Yes  Mobility visit 1 Mobility  Mobility Specialist Start Time (ACUTE ONLY) 1100  Mobility Specialist Stop Time (ACUTE ONLY) 1123  Mobility Specialist Time Calculation (min) (ACUTE ONLY) 23 min   Pt received in bed and agreeable to mobility. Pt was CG d/t unsteadiness. Distance limited to pain in lower back & abdomen. RN made aware. No other complaints during session.  Pt to recliner after session with all needs met.    [facility]

## 2024-02-15 ENCOUNTER — Other Ambulatory Visit (HOSPITAL_COMMUNITY): Payer: Self-pay

## 2024-02-15 ENCOUNTER — Other Ambulatory Visit: Payer: Self-pay | Admitting: Nurse Practitioner

## 2024-02-15 LAB — CULTURE, BLOOD (ROUTINE X 2): Culture: NO GROWTH

## 2024-02-15 MED ORDER — CIPROFLOXACIN HCL 500 MG PO TABS
500.0000 mg | ORAL_TABLET | Freq: Two times a day (BID) | ORAL | Status: DC
  Administered 2024-02-15: 500 mg via ORAL
  Filled 2024-02-15: qty 1

## 2024-02-15 MED ORDER — CIPROFLOXACIN HCL 500 MG PO TABS
500.0000 mg | ORAL_TABLET | Freq: Two times a day (BID) | ORAL | 0 refills | Status: AC
  Filled 2024-02-15: qty 6, 3d supply, fill #0

## 2024-02-15 NOTE — Progress Notes (Signed)
 Physical Therapy Treatment Patient Details Name: Sylvia Vega MRN: [id number] DOB: 1985-10-15 Today's Date: 02/15/2024   History of Present Illness 38 y.o. female who is admitted to [facility] on 02/10/2024 with severe sepsis due to right-sided pyelonephritis after presenting from home to [facility] ED complaining of right flank discomfort. PMH: sickle cell trait, acquired hypothyroidism    PT Comments  Pt agreeable to session. I stopped in at 1118 for initial session, pt requested pain meds prior to participation. Agreeable this time, and is able to complete mobility in room at sup A with SPC, once in the hallway, pt has an episode of light headedness which she attributes to other pt coughing, states normal for her. She is able to amb x175ft with SPC and completes stairs x9 to mimic her home entry, per pt she has 7 steps with R HR when ascending. Returns to room and opts for recliner.     If plan is discharge home, recommend the following: Help with stairs or ramp for entrance   Can travel by private vehicle        Equipment Recommendations  Cane    Recommendations for Other Services       Precautions / Restrictions Precautions Precautions: Fall Recall of Precautions/Restrictions: Intact Restrictions Weight Bearing Restrictions Per Provider Order: No     Mobility  Bed Mobility Overal bed mobility: Modified Independent Bed Mobility: Supine to Sit     Supine to sit: Supervision     General bed mobility comments: self able with increased time    Transfers Overall transfer level: Needs assistance Equipment used: None Transfers: Sit to/from Stand Sit to Stand: Supervision           General transfer comment: self able with increased time with good use of hands to steady self    Ambulation/Gait Ambulation/Gait assistance: Supervision Gait Distance (Feet): 180 Feet Assistive device: Straight cane Gait Pattern/deviations: Step-through pattern, Decreased stride  length, Trunk flexed, Narrow base of support Gait velocity: decreased     General Gait Details: Pt amb with SPC and is able to complete step through pattern with dec stride length and reciprocal pattern. intermittent bouts holding cane off the ground while taking steps   Stairs Stairs: Yes Stairs assistance: Supervision Stair Management: One rail Right, Alternating pattern, Step to pattern Number of Stairs: 9 General stair comments: reciprocal ascension and non recip descending with RLE lead, HR R up, L down   Wheelchair Mobility     Tilt Bed    Modified Rankin (Stroke Patients Only)       Balance Overall balance assessment: Needs assistance Sitting-balance support: No upper extremity supported, Feet unsupported Sitting balance-Leahy Scale: Good     Standing balance support: During functional activity, No upper extremity supported Standing balance-Leahy Scale: Fair                              Communication    Cognition Arousal: Alert Behavior During Therapy: WFL for tasks assessed/performed   PT - Cognitive impairments: No apparent impairments                                Cueing    Exercises      General Comments        Pertinent Vitals/Pain Pain Assessment Pain Assessment: 0-10 Pain Score: 8  Pain Location: back Pain Descriptors / Indicators: Grimacing, Constant Pain  Intervention(s): Monitored during session, Limited activity within patient's tolerance, Repositioned, Premedicated before session    Home Living                          Prior Function            PT Goals (current goals can now be found in the care plan section) Acute Rehab PT Goals Patient Stated Goal: less pain PT Goal Formulation: With patient Time For Goal Achievement: 02/18/24 Potential to Achieve Goals: Good Progress towards PT goals: Progressing toward goals    Frequency    Min 2X/week      PT Plan      Co-evaluation               AM-PAC PT "6 Clicks" Mobility   Outcome Measure  Help needed turning from your back to your side while in a flat bed without using bedrails?: None Help needed moving from lying on your back to sitting on the side of a flat bed without using bedrails?: None Help needed moving to and from a bed to a chair (including a wheelchair)?: None Help needed standing up from a chair using your arms (e.g., wheelchair or bedside chair)?: None Help needed to walk in hospital room?: A Little Help needed climbing 3-5 steps with a railing? : A Little 6 Click Score: 22    End of Session Equipment Utilized During Treatment: Gait belt Activity Tolerance: Patient tolerated treatment well Patient left: with call bell/phone within reach;in chair;with chair alarm set Nurse Communication: Mobility status PT Visit Diagnosis: Other abnormalities of gait and mobility (R26.89)     Time: 1140-1157 PT Time Calculation (min) (ACUTE ONLY): 17 min  Charges:    $Gait Training: 8-22 mins PT General Charges $$ ACUTE PT VISIT: 1 Visit                     Darien Eden, PT Acute Rehabilitation Services Office: [phone] 02/15/2024    Serafin Dames 02/15/2024, 12:07 PM

## 2024-02-15 NOTE — Plan of Care (Signed)
  Problem: Clinical Measurements: Goal: Ability to maintain clinical measurements within normal limits will improve 02/15/2024 0622 by Marlaine Silos, RN Outcome: Progressing 02/15/2024 0621 by Marlaine Silos, RN Outcome: Progressing Goal: Diagnostic test results will improve 02/15/2024 0622 by Marlaine Silos, RN Outcome: Progressing 02/15/2024 0621 by Marlaine Silos, RN Outcome: Progressing Goal: Respiratory complications will improve Outcome: Progressing Goal: Cardiovascular complication will be avoided Outcome: Progressing   Problem: Activity: Goal: Risk for activity intolerance will decrease Outcome: Progressing   Problem: Pain Managment: Goal: General experience of comfort will improve and/or be controlled 02/15/2024 0622 by Marlaine Silos, RN Outcome: Progressing 02/15/2024 0621 by Marlaine Silos, RN Outcome: Progressing   Problem: Safety: Goal: Ability to remain free from injury will improve Outcome: Progressing

## 2024-02-15 NOTE — Discharge Summary (Signed)
 Physician Discharge Summary  Sylvia Vega VOZ:[id number] DOB: [birth year] DOA: 02/10/2024  PCP: Pcp, No  Admit date: 02/10/2024  Discharge date: 02/15/2024  Discharge Diagnoses:  Principal Problem:   Pyelonephritis of right kidney Active Problems:   Severe sepsis Sylvia Vega)   Essential hypertension   Acute right flank pain   Hypokalemia   Hypoglycemia   Depression   Acquired hypothyroidism   Discharge Condition: Stable  Disposition:  Pt is discharged home in good condition and is to follow up with Pcp, No this week to have labs evaluated. Sylvia Vega is instructed to increase activity slowly and balance with rest for the next few days, and use prescribed medication to complete treatment of pain  Diet: Regular Wt Readings from Last 3 Encounters:  02/15/24 71.4 kg  07/27/19 59 kg    History of present illness:  Sylvia Vega is a 38 y.o. female with medical history significant for sickle cell trait, acquired hypothyroidism, depression & Anxiety, Essential hypertension, Acquired hypothyroidism who is admitted with severe sepsis due to right-sided pyelonephritis.  Patient denies Nausea, chest pain, cough and shortness of breath today. She reports improved right sided pain today and rates it at 7/10.   ED course: Patient was treated with IV fluid, IV pain medication, started on antibiotics, admitted for pyelonephritis.  Inpatient admission for ongoing management. BP 137/87  Pulse (!) 139  Temp (!) 104.5 F (40.3 C) (Oral)  Resp (!) 21  Ht 5\' 5"  (1.651 m)  Wt 72.6 kg  SpO2 93%  BMI 26.63 kg/m   Result Value     Potassium 3.3 (*)      Glucose, Bld 69 (*)      All other components within normal limits  CBC WITH DIFFERENTIAL/PLATELET - Abnormal; Notable for the following components:    WBC 12.4 (*)      RBC 3.80 (*)      Neutro Abs 10.5 (*)      [facility] Course:  Patient was admitted for sickle cell pain crisis and managed appropriately with IVF, IV Dilaudid  via PCA and IV  Toradol , as well as other adjunct therapies per sickle cell pain management protocols.  Patient tolerated IV antibiotic.  Reports significant improvement in right flank pain, tolerating p.o. with no restrictions.  She is able to ambulate without assistance. She will complete 3 days of  P.O antibiotics (ciprofloxacin) at discharge. Patient was therefore discharged home today in a hemodynamically stable condition.   Sylvia Vega will follow-up with PCP within 1 week of this discharge. Sylvia Vega was counseled extensively about nonpharmacologic means of pain management, patient verbalized understanding and was appreciative of  the care received during this admission.   We discussed the need for good hydration, monitoring of hydration status, avoidance of heat, cold, stress, and infection triggers. We discussed the need to be adherent with taking other home medications. Patient was reminded of the need to seek medical attention immediately if any symptom of bleeding, anemia, or infection occurs.  Discharge Exam: Vitals:   02/14/24 1910 02/15/24 0535  BP: (!) 137/99 111/83  Pulse: 85 77  Resp: 20 19  Temp: 98.7 F (37.1 C) 97.8 F (36.6 C)  SpO2: 98% 96%   Vitals:   02/14/24 1525 02/14/24 1910 02/15/24 0454 02/15/24 0535  BP: (!) 137/109 (!) 137/99  111/83  Pulse: 86 85  77  Resp: 20 20  19   Temp:  98.7 F (37.1 C)  97.8 F (36.6 C)  TempSrc:  Oral    SpO2: 98% 98%  96%  Weight:   71.4 kg   Height:        General appearance : Awake, alert, not in any distress. Speech Clear. Not toxic looking HEENT: Atraumatic and Normocephalic, pupils equally reactive to light and accomodation Neck: Supple, no JVD. No cervical lymphadenopathy.  Chest: Good air entry bilaterally, no added sounds  CVS: S1 S2 regular, no murmurs.  Abdomen: Bowel sounds present, Non tender and not distended with no gaurding, rigidity or rebound. Extremities: B/L Lower Ext shows no edema, both legs are warm to  touch Neurology: Awake alert, and oriented X 3, CN II-XII intact, Non focal Skin: No Rash  Discharge Instructions  Discharge Instructions     Call MD for:  persistant nausea and vomiting   Complete by: As directed    Call MD for:  temperature >100.4   Complete by: As directed    Diet - low sodium heart healthy   Complete by: As directed    Increase activity slowly   Complete by: As directed       Allergies as of 02/15/2024       Reactions   Fentanyl Anaphylaxis, Dermatitis, Hives, Nausea And Vomiting, Palpitations   Severe n/v, increased heart rate,   Ketorolac  Anaphylaxis, Hives, Nausea Only   Latex Hives   Morphine And Codeine Hives   Elevated heart rate   Penicillins Hives   Yeast infection Did it involve swelling of the face/tongue/throat, SOB, or low BP? No Did it involve sudden or severe rash/hives, skin peeling, or any reaction on the inside of your mouth or nose? No Did you need to seek medical attention at a [facility] or doctor's office? No When did it last happen?       If all above answers are "NO", may proceed with cephalosporin use.   Haloperidol Itching        Medication List     TAKE these medications    albuterol  108 (90 Base) MCG/ACT inhaler Commonly known as: VENTOLIN  HFA Inhale 2 puffs into the lungs every 4 (four) hours as needed for wheezing or shortness of breath.   ARIPiprazole 2 MG tablet Commonly known as: ABILIFY Take 1 tablet by mouth daily.   cetirizine HCl 5 MG/5ML Soln Commonly known as: Zyrtec Take 10 mg by mouth daily as needed for allergies.   ciprofloxacin 500 MG tablet Commonly known as: CIPRO Take 1 tablet (500 mg total) by mouth 2 (two) times daily.   clindamycin 1 % gel Commonly known as: CLINDAGEL Apply 1 application  topically 2 (two) times daily as needed.   clonazePAM 0.5 MG tablet Commonly known as: KLONOPIN Take 0.5 mg by mouth daily.   desonide 0.05 % cream Commonly known as: DESOWEN Apply 1 application  topically 2 (two) times daily.   dicyclomine 20 MG tablet Commonly known as: BENTYL Take 20 mg by mouth.   DULoxetine 30 MG capsule Commonly known as: CYMBALTA Take 30 mg by mouth daily.   fluticasone 110 MCG/ACT inhaler Commonly known as: FLOVENT HFA Inhale 1 puff into the lungs 2 (two) times daily as needed (wheezing).   folic acid 1 MG tablet Commonly known as: FOLVITE Take 1 mg by mouth daily.   ibuprofen 800 MG tablet Commonly known as: ADVIL Take 800 mg by mouth every 8 (eight) hours as needed for moderate pain.   levothyroxine  25 MCG tablet Commonly known as: SYNTHROID  Take 25 mcg by mouth daily before breakfast.   metoprolol succinate 25 MG 24 hr tablet Commonly  known as: TOPROL-XL Take 25 mg by mouth daily.   mirtazapine 15 MG tablet Commonly known as: REMERON Take 30 mg by mouth at bedtime.   omeprazole 20 MG capsule Commonly known as: PRILOSEC Take 20 mg by mouth daily.   oxyCODONE 15 MG immediate release tablet Commonly known as: ROXICODONE Take 15 mg by mouth 4 (four) times daily as needed.   polyethylene glycol powder 17 GM/SCOOP powder Commonly known as: GLYCOLAX/MIRALAX Take 17 g by mouth as needed.   promethazine 6.25 MG/5ML syrup Commonly known as: PHENERGAN Take 6.25 mg by mouth every 8 (eight) hours as needed for nausea or vomiting.   tiZANidine 4 MG tablet Commonly known as: ZANAFLEX Take 4 mg by mouth every 8 (eight) hours as needed for muscle spasms.   Venlafaxine HCl 150 MG Tb24 Take 150 mg by mouth daily.        The results of significant diagnostics from this hospitalization (including imaging, microbiology, ancillary and laboratory) are listed below for reference.    Significant Diagnostic Studies: CT Renal Stone Study Result Date: 02/10/2024 CLINICAL DATA:  Abdominal and flank pain EXAM: CT ABDOMEN AND PELVIS WITHOUT CONTRAST TECHNIQUE: Multidetector CT imaging of the abdomen and pelvis was performed following the standard  protocol without IV contrast. RADIATION DOSE REDUCTION: This exam was performed according to the departmental dose-optimization program which includes automated exposure control, adjustment of the mA and/or kV according to patient size and/or use of iterative reconstruction technique. COMPARISON:  CT abdomen and pelvis 11/04/2010 FINDINGS: Lower chest: There is atelectasis in the lingula. Hepatobiliary: No focal liver abnormality is seen. No gallstones, gallbladder wall thickening, or biliary dilatation. Pancreas: Unremarkable. No pancreatic ductal dilatation or surrounding inflammatory changes. Spleen: Normal in size without focal abnormality. Adrenals/Urinary Tract: There is prominence of the mid and proximal right ureter with mild surrounding inflammatory stranding. The distal right ureter is difficult to visualize. No definitive urinary tract calculi are seen. The adrenal glands, left kidney and bladder are within normal limits. Stomach/Bowel: Stomach is within normal limits. Appendix appears normal. No evidence of bowel wall thickening, distention, or inflammatory changes. Vascular/Lymphatic: No significant vascular findings are present. No enlarged abdominal or pelvic lymph nodes. Reproductive: There is an IUD in the uterus. Adnexa are within normal limits. Other: No abdominal wall hernia or abnormality. No abdominopelvic ascites. Musculoskeletal: No acute or significant osseous findings. IMPRESSION: 1. Prominence of the mid and proximal right ureter with mild surrounding inflammatory stranding. No definitive urinary tract calculi are seen. Findings may be related to recently passed stone or infection. 2. IUD in the uterus. Electronically Signed   By: Tyron Gallon M.D.   On: 02/10/2024 22:43   DG Chest 2 View Result Date: 02/10/2024 CLINICAL DATA:  Suspected sepsis, flank pain EXAM: CHEST - 2 VIEW COMPARISON:  05/19/2011 FINDINGS: The heart size and mediastinal contours are within normal limits. Both lungs  are clear. The visualized skeletal structures are unremarkable. IMPRESSION: No active cardiopulmonary disease. Electronically Signed   By: Janeece Mechanic M.D.   On: 02/10/2024 20:31    Microbiology: Recent Results (from the past 240 hours)  Culture, blood (Routine x 2)     Status: Abnormal   Collection Time: 02/10/24  8:37 PM   Specimen: BLOOD LEFT ARM  Result Value Ref Range Status   Specimen Description   Final    BLOOD LEFT ARM Performed at [facility], [address], [place], [place] [address]    Special Requests   Final  BOTTLES DRAWN AEROBIC AND ANAEROBIC Blood Culture adequate volume Performed at [facility], [address], [place], [place] [address]    Culture  Setup Time   Final    GRAM NEGATIVE RODS IN BOTH AEROBIC AND ANAEROBIC BOTTLES CRITICAL RESULT CALLED TO, READ BACK BY AND VERIFIED WITH: Glennis Lansing [facility] [phone] [id number] BY Chestine Costain, [place] Performed at [facility], [address], [place], [place] [address]    Culture ESCHERICHIA COLI (A)  Final   Report Status 02/13/2024 FINAL  Final   Organism ID, Bacteria ESCHERICHIA COLI  Final   Organism ID, Bacteria ESCHERICHIA COLI  Final      Susceptibility   Escherichia coli - KIRBY BAUER*    CEFAZOLIN INTERMEDIATE Intermediate    Escherichia coli - MIC*    AMPICILLIN >=32 RESISTANT Resistant     CEFEPIME <=0.12 SENSITIVE Sensitive     CEFTAZIDIME <=1 SENSITIVE Sensitive     CEFTRIAXONE <=0.25 SENSITIVE Sensitive     CIPROFLOXACIN <=0.25 SENSITIVE Sensitive     GENTAMICIN <=1 SENSITIVE Sensitive     IMIPENEM <=0.25 SENSITIVE Sensitive     TRIMETH/SULFA >=320 RESISTANT Resistant     AMPICILLIN/SULBACTAM 16 INTERMEDIATE Intermediate     PIP/TAZO <=4 SENSITIVE Sensitive ug/mL    * ESCHERICHIA COLI    ESCHERICHIA COLI  Blood Culture ID Panel (Reflexed)     Status: Abnormal   Collection Time: 02/10/24  8:37 PM  Result Value Ref Range Status   Enterococcus faecalis NOT DETECTED NOT  DETECTED Final   Enterococcus Faecium NOT DETECTED NOT DETECTED Final   Listeria monocytogenes NOT DETECTED NOT DETECTED Final   Staphylococcus species NOT DETECTED NOT DETECTED Final   Staphylococcus aureus (BCID) NOT DETECTED NOT DETECTED Final   Staphylococcus epidermidis NOT DETECTED NOT DETECTED Final   Staphylococcus lugdunensis NOT DETECTED NOT DETECTED Final   Streptococcus species NOT DETECTED NOT DETECTED Final   Streptococcus agalactiae NOT DETECTED NOT DETECTED Final   Streptococcus pneumoniae NOT DETECTED NOT DETECTED Final   Streptococcus pyogenes NOT DETECTED NOT DETECTED Final   A.calcoaceticus-baumannii NOT DETECTED NOT DETECTED Final   Bacteroides fragilis NOT DETECTED NOT DETECTED Final   Enterobacterales DETECTED (A) NOT DETECTED Final    Comment: Enterobacterales represent a large order of gram negative bacteria, not a single organism. CRITICAL RESULT CALLED TO, READ BACK BY AND VERIFIED WITH: PHARMD DREW WOFFORD [phone] 1207 BY J RAZZAK, MT    Enterobacter cloacae complex NOT DETECTED NOT DETECTED Final   Escherichia coli DETECTED (A) NOT DETECTED Final    Comment: CRITICAL RESULT CALLED TO, READ BACK BY AND VERIFIED WITH: Glennis Lansing [facility] [phone] 1207 BY J RAZZAK, MT    Klebsiella aerogenes NOT DETECTED NOT DETECTED Final   Klebsiella oxytoca NOT DETECTED NOT DETECTED Final   Klebsiella pneumoniae NOT DETECTED NOT DETECTED Final   Proteus species NOT DETECTED NOT DETECTED Final   Salmonella species NOT DETECTED NOT DETECTED Final   Serratia marcescens NOT DETECTED NOT DETECTED Final   Haemophilus influenzae NOT DETECTED NOT DETECTED Final   Neisseria meningitidis NOT DETECTED NOT DETECTED Final   Pseudomonas aeruginosa NOT DETECTED NOT DETECTED Final   Stenotrophomonas maltophilia NOT DETECTED NOT DETECTED Final   Candida albicans NOT DETECTED NOT DETECTED Final   Candida auris NOT DETECTED NOT DETECTED Final   Candida glabrata NOT DETECTED NOT DETECTED  Final   Candida krusei NOT DETECTED NOT DETECTED Final   Candida parapsilosis NOT DETECTED NOT DETECTED Final   Candida tropicalis  NOT DETECTED NOT DETECTED Final   Cryptococcus neoformans/gattii NOT DETECTED NOT DETECTED Final   CTX-M ESBL NOT DETECTED NOT DETECTED Final   Carbapenem resistance IMP NOT DETECTED NOT DETECTED Final   Carbapenem resistance KPC NOT DETECTED NOT DETECTED Final   Carbapenem resistance NDM NOT DETECTED NOT DETECTED Final   Carbapenem resist OXA 48 LIKE NOT DETECTED NOT DETECTED Final   Carbapenem resistance VIM NOT DETECTED NOT DETECTED Final    Comment: Performed at [facility], [address], [place], [place] [address]  Resp panel by RT-PCR (RSV, Flu A&B, Covid) Anterior Nasal Swab     Status: None   Collection Time: 02/10/24  8:51 PM   Specimen: Anterior Nasal Swab  Result Value Ref Range Status   SARS Coronavirus 2 by RT PCR NEGATIVE NEGATIVE Final    Comment: (NOTE) SARS-CoV-2 target nucleic acids are NOT DETECTED.  The SARS-CoV-2 RNA is generally detectable in upper respiratory specimens during the acute phase of infection. The lowest concentration of SARS-CoV-2 viral copies this assay can detect is 138 copies/mL. A negative result does not preclude SARS-Cov-2 infection and should not be used as the sole basis for treatment or other patient management decisions. A negative result may occur with  improper specimen collection/handling, submission of specimen other than nasopharyngeal swab, presence of viral mutation(s) within the areas targeted by this assay, and inadequate number of viral copies(<138 copies/mL). A negative result must be combined with clinical observations, patient history, and epidemiological information. The expected result is Negative.  Fact Sheet for Patients:  BloggerCourse.com  Fact Sheet for Healthcare Providers:  SeriousBroker.it  This test is no t yet approved or  cleared by the [place]  FDA and  has been authorized for detection and/or diagnosis of SARS-CoV-2 by FDA under an Emergency Use Authorization (EUA). This EUA will remain  in effect (meaning this test can be used) for the duration of the COVID-19 declaration under Section 564(b)(1) of the Act, 21 U.S.C.section 360bbb-3(b)(1), unless the authorization is terminated  or revoked sooner.       Influenza A by PCR NEGATIVE NEGATIVE Final   Influenza B by PCR NEGATIVE NEGATIVE Final    Comment: (NOTE) The Xpert Xpress SARS-CoV-2/FLU/RSV plus assay is intended as an aid in the diagnosis of influenza from Nasopharyngeal swab specimens and should not be used as a sole basis for treatment. Nasal washings and aspirates are unacceptable for Xpert Xpress SARS-CoV-2/FLU/RSV testing.  Fact Sheet for Patients: BloggerCourse.com  Fact Sheet for Healthcare Providers: SeriousBroker.it  This test is not yet approved or cleared by the [place]  FDA and has been authorized for detection and/or diagnosis of SARS-CoV-2 by FDA under an Emergency Use Authorization (EUA). This EUA will remain in effect (meaning this test can be used) for the duration of the COVID-19 declaration under Section 564(b)(1) of the Act, 21 U.S.C. section 360bbb-3(b)(1), unless the authorization is terminated or revoked.     Resp Syncytial Virus by PCR NEGATIVE NEGATIVE Final    Comment: (NOTE) Fact Sheet for Patients: BloggerCourse.com  Fact Sheet for Healthcare Providers: SeriousBroker.it  This test is not yet approved or cleared by the [place]  FDA and has been authorized for detection and/or diagnosis of SARS-CoV-2 by FDA under an Emergency Use Authorization (EUA). This EUA will remain in effect (meaning this test can be used) for the duration of the COVID-19 declaration under Section 564(b)(1) of the Act, 21  U.S.C. section 360bbb-3(b)(1), unless the authorization is terminated or revoked.  Performed  at [facility], [address] [place], [place] [address]   Culture, blood (Routine x 2)     Status: None   Collection Time: 02/10/24  9:29 PM   Specimen: BLOOD RIGHT FOREARM  Result Value Ref Range Status   Specimen Description   Final    BLOOD RIGHT FOREARM Performed at [facility], [address] [place], [place] [address]    Special Requests   Final    BOTTLES DRAWN AEROBIC AND ANAEROBIC Blood Culture results may not be optimal due to an inadequate volume of blood received in culture bottles Performed at [facility], [address], [place], [place] [address]    Culture   Final    NO GROWTH 5 DAYS Performed at [facility], [address] [place], [place] [address]    Report Status 02/15/2024 FINAL  Final  Urine Culture     Status: Abnormal   Collection Time: 02/10/24 10:01 PM   Specimen: Urine, Random  Result Value Ref Range Status   Specimen Description   Final    URINE, RANDOM Performed at [facility], [address] [place], [place] [address]    Special Requests   Final    NONE Reflexed from [id number] Performed at [facility], [address], [place], [place] [address]    Culture (A)  Final    <10,000 COLONIES/mL INSIGNIFICANT GROWTH Performed at [facility], [address] [place], [place] [address]    Report Status 02/12/2024 FINAL  Final     Labs: Basic Metabolic Panel: Recent Labs  Lab 02/10/24 2037 02/11/24 0242  NA 138 138  K 3.3* 3.4*  CL 102 105  CO2 26 26  GLUCOSE 69* 105*  BUN 9 8  CREATININE 0.67 0.64  CALCIUM 9.2 8.5*  MG 2.0 1.5*   Liver Function Tests: Recent Labs  Lab 02/10/24 2037 02/11/24 0242  AST 16 14*  ALT 10 9  ALKPHOS 81 66  BILITOT 0.2 0.7  PROT 7.8 6.5  ALBUMIN 4.2 3.4*   No results for input(s): "LIPASE", "AMYLASE" in the  last 168 hours. No results for input(s): "AMMONIA" in the last 168 hours. CBC: Recent Labs  Lab 02/10/24 2037 02/11/24 0242  WBC 12.4* 14.2*  NEUTROABS 10.5* 11.7*  HGB 12.4 11.7*  HCT 36.0 34.3*  MCV 94.7 94.2  PLT 171 127*   Cardiac Enzymes: No results for input(s): "CKTOTAL", "CKMB", "CKMBINDEX", "TROPONINI" in the last 168 hours. BNP: Invalid input(s): "POCBNP" CBG: No results for input(s): "GLUCAP" in the last 168 hours.  Time coordinating discharge: 50 minutes  Signed:  Lorel Roes NP 02/15/2024, 2:26 PM
